# Patient Record
Sex: Male | Born: 1950
Health system: Southern US, Community
[De-identification: ages and names within clinical notes are randomized; demographics above are authoritative.]

## PROBLEM LIST (undated history)

## (undated) DIAGNOSIS — E785 Hyperlipidemia, unspecified: Secondary | ICD-10-CM

## (undated) DIAGNOSIS — I236 Thrombosis of atrium, auricular appendage, and ventricle as current complications following acute myocardial infarction: Secondary | ICD-10-CM

## (undated) DIAGNOSIS — F1011 Alcohol abuse, in remission: Secondary | ICD-10-CM

## (undated) DIAGNOSIS — I2510 Atherosclerotic heart disease of native coronary artery without angina pectoris: Secondary | ICD-10-CM

## (undated) DIAGNOSIS — I255 Ischemic cardiomyopathy: Secondary | ICD-10-CM

## (undated) DIAGNOSIS — I10 Essential (primary) hypertension: Secondary | ICD-10-CM

## (undated) HISTORY — DX: Atherosclerotic heart disease of native coronary artery without angina pectoris: I25.10

## (undated) HISTORY — DX: Thrombosis of atrium, auricular appendage, and ventricle as current complications following acute myocardial infarction: I23.6

## (undated) HISTORY — DX: Alcohol abuse, in remission: F10.11

## (undated) HISTORY — DX: Ischemic cardiomyopathy: I25.5

## (undated) HISTORY — PX: OTHER SURGICAL HISTORY: SHX169

## (undated) HISTORY — DX: Essential (primary) hypertension: I10

## (undated) HISTORY — DX: Hyperlipidemia, unspecified: E78.5

---

## 2005-11-05 ENCOUNTER — Inpatient Hospital Stay (HOSPITAL_COMMUNITY): Admission: EM | Admit: 2005-11-05 | Discharge: 2005-11-14 | Payer: Self-pay | Admitting: Emergency Medicine

## 2008-08-19 ENCOUNTER — Ambulatory Visit: Payer: Self-pay | Admitting: Cardiology

## 2008-08-29 HISTORY — PX: CORONARY STENT PLACEMENT: SHX1402

## 2008-09-16 ENCOUNTER — Encounter: Payer: Self-pay | Admitting: Cardiology

## 2008-09-21 ENCOUNTER — Telehealth (INDEPENDENT_AMBULATORY_CARE_PROVIDER_SITE_OTHER): Payer: Self-pay | Admitting: *Deleted

## 2008-09-22 ENCOUNTER — Inpatient Hospital Stay (HOSPITAL_COMMUNITY): Admission: AD | Admit: 2008-09-22 | Discharge: 2008-09-25 | Payer: Self-pay | Admitting: Cardiology

## 2008-09-22 ENCOUNTER — Ambulatory Visit: Payer: Self-pay | Admitting: Cardiology

## 2008-09-22 ENCOUNTER — Encounter: Payer: Self-pay | Admitting: Cardiology

## 2008-09-22 ENCOUNTER — Ambulatory Visit: Payer: Self-pay

## 2008-09-22 ENCOUNTER — Encounter: Payer: Self-pay | Admitting: Cardiovascular Disease

## 2008-09-22 DIAGNOSIS — R9430 Abnormal result of cardiovascular function study, unspecified: Secondary | ICD-10-CM | POA: Insufficient documentation

## 2008-09-22 DIAGNOSIS — E7800 Pure hypercholesterolemia, unspecified: Secondary | ICD-10-CM | POA: Insufficient documentation

## 2008-09-23 ENCOUNTER — Encounter: Payer: Self-pay | Admitting: Cardiovascular Disease

## 2008-09-23 ENCOUNTER — Encounter: Payer: Self-pay | Admitting: Cardiology

## 2008-09-24 ENCOUNTER — Encounter: Payer: Self-pay | Admitting: Cardiovascular Disease

## 2008-09-29 ENCOUNTER — Encounter: Payer: Self-pay | Admitting: Cardiology

## 2008-09-29 ENCOUNTER — Telehealth (INDEPENDENT_AMBULATORY_CARE_PROVIDER_SITE_OTHER): Payer: Self-pay | Admitting: *Deleted

## 2008-09-30 ENCOUNTER — Telehealth: Payer: Self-pay | Admitting: Cardiology

## 2008-10-05 ENCOUNTER — Ambulatory Visit: Payer: Self-pay | Admitting: Cardiovascular Disease

## 2008-10-05 LAB — CONVERTED CEMR LAB: POC INR: 1.1

## 2008-10-08 ENCOUNTER — Ambulatory Visit: Payer: Self-pay | Admitting: Cardiology

## 2008-10-08 DIAGNOSIS — I426 Alcoholic cardiomyopathy: Secondary | ICD-10-CM | POA: Insufficient documentation

## 2008-10-08 DIAGNOSIS — F17200 Nicotine dependence, unspecified, uncomplicated: Secondary | ICD-10-CM | POA: Insufficient documentation

## 2008-10-08 DIAGNOSIS — I2510 Atherosclerotic heart disease of native coronary artery without angina pectoris: Secondary | ICD-10-CM | POA: Insufficient documentation

## 2008-10-08 DIAGNOSIS — I238 Other current complications following acute myocardial infarction: Secondary | ICD-10-CM | POA: Insufficient documentation

## 2008-10-08 LAB — CONVERTED CEMR LAB: POC INR: 2.5

## 2008-11-03 ENCOUNTER — Encounter: Payer: Self-pay | Admitting: Cardiology

## 2008-11-04 ENCOUNTER — Ambulatory Visit: Payer: Self-pay | Admitting: Cardiology

## 2008-11-23 ENCOUNTER — Encounter: Payer: Self-pay | Admitting: Cardiology

## 2008-12-16 ENCOUNTER — Ambulatory Visit: Payer: Self-pay | Admitting: Cardiology

## 2009-01-26 ENCOUNTER — Encounter: Payer: Self-pay | Admitting: Cardiology

## 2009-01-27 ENCOUNTER — Encounter: Payer: Self-pay | Admitting: Cardiology

## 2009-02-18 ENCOUNTER — Encounter: Payer: Self-pay | Admitting: Cardiology

## 2009-03-09 ENCOUNTER — Encounter: Payer: Self-pay | Admitting: Cardiology

## 2009-03-09 ENCOUNTER — Ambulatory Visit (HOSPITAL_COMMUNITY): Admission: RE | Admit: 2009-03-09 | Discharge: 2009-03-09 | Payer: Self-pay | Admitting: Cardiology

## 2009-03-09 ENCOUNTER — Ambulatory Visit: Payer: Self-pay | Admitting: Cardiology

## 2009-04-26 ENCOUNTER — Telehealth: Payer: Self-pay | Admitting: Cardiovascular Disease

## 2009-05-05 ENCOUNTER — Encounter: Payer: Self-pay | Admitting: Cardiology

## 2009-05-31 ENCOUNTER — Encounter: Admission: RE | Admit: 2009-05-31 | Discharge: 2009-08-29 | Payer: Self-pay | Admitting: Family Medicine

## 2009-07-22 ENCOUNTER — Encounter: Payer: Self-pay | Admitting: Cardiology

## 2009-09-01 ENCOUNTER — Encounter: Payer: Self-pay | Admitting: Cardiology

## 2009-09-03 ENCOUNTER — Encounter: Payer: Self-pay | Admitting: Cardiology

## 2009-09-07 ENCOUNTER — Ambulatory Visit: Payer: Self-pay | Admitting: Cardiology

## 2009-12-20 ENCOUNTER — Telehealth: Payer: Self-pay | Admitting: Cardiology

## 2010-01-20 LAB — HEMOCCULT GUIAC POC 1CARD (OFFICE)

## 2010-05-22 ENCOUNTER — Encounter: Payer: Self-pay | Admitting: Family Medicine

## 2010-05-31 NOTE — Assessment & Plan Note (Signed)
Summary: 6 month rov 414.01 428.22  pfh,rn   Visit Type:  Follow-up Primary Provider:  Vernon Prey MD  CC:  CAD/Cardiomyopathy.  History of Present Illness: The pt presents for followup of the above. Since I last saw him he has had no new cardiovascular complaints. I repeated an echocardiogram and his ejection fraction has improved to 45%. There was no evidence of the previous apical thrombus. His Coumadin was discontinued and he remains on aspirin and Plavix. Since that time he has had no new chest pressure, neck or arm discomfort. He has had no palpitations, presyncope or syncope. He has had no PND or orthopnea. He continues to smoke but is down to one pack per day. He has now been abstaining from alcohol for 4 years.  Current Medications (verified): 1)  Crestor 20 Mg Tabs (Rosuvastatin Calcium) .... 1/2 Daily Except M,w,fri 1 Daily 2)  Plavix 75 Mg Tabs (Clopidogrel Bisulfate) .... Take One Tablet By Mouth Daily 3)  Lisinopril 5 Mg Tabs (Lisinopril) .... Take One Tablet By Mouth Daily 4)  Carvedilol 3.125 Mg Tabs (Carvedilol) .... One By Mouth Bid 5)  Nitroglycerin 0.4 Mg Subl (Nitroglycerin) .... One Tablet Under Tongue Every 5 Minutes As Needed For Chest Pain---May Repeat Times Three 6)  Aspirin 81 Mg  Tabs (Aspirin) .... Daily 7)  Vitamin D 1000 Unit Tabs (Cholecalciferol) .Marland Kitchen.. 1 By Mouth Daily  Allergies (verified): 1)  ! Penicillin  Past History:  Past Medical History: Hyperlipidemia. History of alcohol abuse now sober for 35 months CAD (s/p DES to LAD, occluded RCA) Ischemic cardiomyoapthy (EF 35% initially, 45% 9/11) Apical Thrombus  Review of Systems       As stated in the HPI and negative for all other systems.   Vital Signs:  Patient profile:   60 year old male Height:      69 inches Weight:      171 pounds BMI:     25.34 Pulse rate:   50 / minute Resp:     16 per minute BP sitting:   120 / 70  (right arm)  Vitals Entered By: Marrion Coy, CNA (Sep 07, 2009  2:58 PM)  Physical Exam  General:  Well developed, well nourished, in no acute distress. Head:  normocephalic and atraumatic Neck:  Neck supple, no JVD. No masses, thyromegaly or abnormal cervical nodes. Chest Wall:  no deformities or breast masses noted Lungs:  Dbreath sounds with few expiratory wheezes Abdomen:  Bowel sounds positive; abdomen soft and non-tender without masses, organomegaly, or hernias noted. No hepatosplenomegaly. Msk:  Back normal, normal gait. Muscle strength and tone normal. Extremities:  No clubbing or cyanosis. Neurologic:  Alert and oriented x 3. Skin:  Intact without lesions or rashes.   Detailed Cardiovascular Exam  Neck    Carotids: Carotids full and equal bilaterally without bruits.      Neck Veins: Normal, no JVD.    Heart    Inspection: no deformities or lifts noted.      Palpation: normal PMI with no thrills palpable.      Auscultation: regular rate and rhythm, S1, S2 without murmurs, rubs, gallops, or clicks.    Vascular    Abdominal Aorta: no palpable masses, pulsations, or audible bruits.      Femoral Pulses: normal femoral pulses bilaterally.      Pedal Pulses: pulses normal in all 4 extremities    Radial Pulses: normal radial pulses bilaterally.      Peripheral Circulation: no clubbing, cyanosis, or  edema noted with normal capillary refill.     EKG  Procedure date:  09/07/2009  Findings:      sinus bradycardia, rate 56, old inferior infarct, inferolateral T-wave inversions, no acute ST-T wave changes.  Impression & Recommendations:  Problem # 1:  CARDIOMYOPATHY (ICD-425.4) I am going to try to titrate his lisinopril to 5 mg a.m. and 2.5 mg p.m. I cannot titrate his beta blocker secondary to bradycardia.  Problem # 2:  CAD (ICD-414.00) No further cardiovascular testing is suggested. He will continue with risk reduction. Orders: EKG w/ Interpretation (93000)  Problem # 3:  TOBACCO ABUSE (ICD-305.1) We discussed again the need to  stop smoking completely.  Problem # 4:  HYPERCHOLESTEROLEMIA-PURE (ICD-272.0) He is having is followed by Dr. Christell Constant. He has an appointment to followup results that were not yet at target and medication changes are planned. I would suggest a goal LDL less than 100 and HDL greater than 50 in this patient with ongoing risk factors.  Patient Instructions: 1)  Your physician recommends that you schedule a follow-up appointment in: 6 months with Dr Antoine Poche 2)  Your physician recommends that you have  lab work at Dr [facility] office in 2 week for 428.22 v58.69 3)  Your physician has recommended you make the following change in your medication: Increase Lisinopril by 2.5 mg a day for a total of 7.5 mg daily 4)  You have been diagnosed with Congestive Heart Failure or CHF.  CHF is a condition in which a problem with the structure or function of the heart impairs its ability to supply sufficient blood flow to meet the body's needs.  For further information please visit www.cardiosmart.org for detailed information on CHF. 5)  Your physician recommends that you weigh, daily, at the same time every day, and in the same amount of clothing.  Please record your daily weights on the handout provided and bring it to your next appointment. Prescriptions: LISINOPRIL 2.5 MG TABS (LISINOPRIL) one daily with 5 mg daily for a total of 7.5 mg  #30 x 11   Entered by:   Charolotte Capuchin, RN   Authorized by:   Rollene Rotunda, MD, [facility]   Signed by:   Charolotte Capuchin, RN on 09/07/2009   Method used:   [facility] to        [facility] [phone]* (retail)       [address]       [place], [place]  [address]       Ph: [phone] or [phone]       Fax: [phone]   RxID:   [id number]

## 2010-05-31 NOTE — Miscellaneous (Signed)
  Clinical Lists Changes  Observations: Added new observation of ECHOINTERP: - Left ventricle: The cavity size was normal. Wall thickness was       normal. Systolic function was mildly to moderately reduced. The       estimated ejection fraction was in the range of 40% to 45%. Severe       hypokinesis of the entire inferoposterior and apical myocardium.       Doppler parameters are consistent with abnormal left ventricular       relaxation (grade 1 diastolic dysfunction). Acoustic contrast       opacification revealed no evidence of thrombus.     - Aortic valve: Trivial regurgitation.     Impressions:            - Cannot R/O apical thrombus. (03/09/2009 11:13)      Echocardiogram  Procedure date:  03/09/2009  Findings:      - Left ventricle: The cavity size was normal. Wall thickness was       normal. Systolic function was mildly to moderately reduced. The       estimated ejection fraction was in the range of 40% to 45%. Severe       hypokinesis of the entire inferoposterior and apical myocardium.       Doppler parameters are consistent with abnormal left ventricular       relaxation (grade 1 diastolic dysfunction). Acoustic contrast       opacification revealed no evidence of thrombus.     - Aortic valve: Trivial regurgitation.     Impressions:            - Cannot R/O apical thrombus.

## 2010-05-31 NOTE — Progress Notes (Signed)
Summary: refill  Phone Note Refill Request Message from:  Pharmacy on December 20, 2009 1:16 PM  Refills Requested: Medication #1:  NITROGLYCERIN 0.4 MG SUBL One tablet under tongue every 5 minutes as needed for chest pain---may repeat times three [facility] [phone]   Method Requested: Fax to Local Pharmacy Initial call taken by: Edman Circle,  December 20, 2009 1:16 PM  Follow-up for Phone Call        Rx sent into pharmacy.[facility]  Marrion Coy, CNA  December 20, 2009 1:56 PM  Follow-up by: Marrion Coy, CNA,  December 20, 2009 1:56 PM    Prescriptions: NITROGLYCERIN 0.4 MG SUBL (NITROGLYCERIN) One tablet under tongue every 5 minutes as needed for chest pain---may repeat times three  #25 x 8   Entered by:   Marrion Coy, CNA   Authorized by:   Rollene Rotunda, MD, [facility]   Signed by:   Marrion Coy, CNA on 12/20/2009   Method used:   Electronically to        [facility] [phone]* (retail)       [address]       [place], [place]  [address]       Ph: [phone] or [phone]       Fax: [phone]   RxID:   [id number]

## 2010-08-09 LAB — BASIC METABOLIC PANEL
BUN: 11 mg/dL (ref 6–23)
CO2: 28 mEq/L (ref 19–32)
CO2: 30 mEq/L (ref 19–32)
Calcium: 8.8 mg/dL (ref 8.4–10.5)
Chloride: 105 mEq/L (ref 96–112)
Chloride: 111 mEq/L (ref 96–112)
Creatinine, Ser: 0.87 mg/dL (ref 0.4–1.5)
GFR calc Af Amer: >60 mL/min (ref >60)
Glucose, Bld: 89 mg/dL (ref 70–99)
Potassium: 3.9 mEq/L (ref 3.5–5.1)
Sodium: 140 mEq/L (ref 135–145)

## 2010-08-09 LAB — CBC
HCT: 44.3 % (ref 39.0–52.0)
HCT: 46.6 % (ref 39.0–52.0)
HCT: 47.1 % (ref 39.0–52.0)
Hemoglobin: 15.5 g/dL (ref 13.0–17.0)
Hemoglobin: 16 g/dL (ref 13.0–17.0)
MCHC: 34 g/dL (ref 30.0–36.0)
MCHC: 34 g/dL (ref 30.0–36.0)
MCHC: 34.4 g/dL (ref 30.0–36.0)
MCV: 93.9 fL (ref 78.0–100.0)
MCV: 95 fL (ref 78.0–100.0)
Platelets: 173 10*3/uL (ref 150–400)
RDW: 12.6 % (ref 11.5–15.5)
RDW: 12.9 % (ref 11.5–15.5)
RDW: 12.9 % (ref 11.5–15.5)
WBC: 7.7 10*3/uL (ref 4.0–10.5)
WBC: 8.2 10*3/uL (ref 4.0–10.5)
WBC: 8.6 10*3/uL (ref 4.0–10.5)

## 2010-08-09 LAB — LIPID PANEL
Cholesterol: 120 mg/dL (ref 0–200)
LDL Cholesterol: 76 mg/dL (ref 0–99)
Triglycerides: 47 mg/dL (ref <150)
VLDL: 9 mg/dL (ref 0–40)

## 2010-08-09 LAB — COMPREHENSIVE METABOLIC PANEL
ALT: 17 U/L (ref 0–53)
Glucose, Bld: 102 mg/dL — ABNORMAL HIGH (ref 70–99)
Potassium: 4.5 mEq/L (ref 3.5–5.1)

## 2010-08-09 LAB — CARDIAC PANEL(CRET KIN+CKTOT+MB+TROPI)
CK, MB: 2.9 ng/mL (ref 0.3–4.0)
CK, MB: 3.2 ng/mL (ref 0.3–4.0)
Relative Index: 3 — ABNORMAL HIGH (ref 0.0–2.5)
Relative Index: INVALID (ref 0.0–2.5)
Troponin I: 0.02 ng/mL (ref 0.00–0.06)
Troponin I: 0.02 ng/mL (ref 0.00–0.06)

## 2010-08-09 LAB — TSH: TSH: 1.646 u[IU]/mL (ref 0.350–4.500)

## 2010-08-09 LAB — HEPARIN LEVEL (UNFRACTIONATED): Heparin Unfractionated: 0.47 IU/mL (ref 0.30–0.70)

## 2010-08-09 LAB — PROTIME-INR
INR: 1 (ref 0.00–1.49)
Prothrombin Time: 13.2 seconds (ref 11.6–15.2)

## 2010-08-14 ENCOUNTER — Encounter: Payer: Self-pay | Admitting: Family Medicine

## 2010-09-13 NOTE — Cardiovascular Report (Signed)
NAME:  John Leon, LONGSWORTH NO.:  [id number]   MEDICAL RECORD NO.:  [id number]          PATIENT TYPE:  INP   LOCATION:  2501                         FACILITY:  [facility]   PHYSICIAN:  Verne Carrow, MDDATE OF BIRTH:  Sep 11, 1950   DATE OF PROCEDURE:  09/23/2008  DATE OF DISCHARGE:                            CARDIAC CATHETERIZATION   PRIMARY CARDIOLOGIST:  Rollene Rotunda, MD, [facility]   PROCEDURE PERFORMED:  Percutaneous coronary intervention with placement  of a Xience drug-eluting stent in the mid left anterior descending  coronary artery.   OPERATOR:  Verne Carrow, MD   INDICATIONS:  This is a 60 year old Caucasian male with a history of  hyperlipidemia and former alcohol abuse as well as tobacco use who was  found to have an abnormal stress Myoview and was referred today for a  diagnostic left heart catheterization.  The diagnostic catheterization  was performed by Dr. Charlton Haws.  The patient was found to have a  severe stenosis in the mid left anterior descending coronary artery.  Dr. Eden Emms consulted me to perform a percutaneous coronary intervention  on this severe stenosis.   PROCEDURE IN DETAIL:  The patient was placed in the holding area after  his diagnostic catheterization.  I have talked to both he and his family  in the holding area.  He was given 600 mg of Plavix in the holding area  prior to coming back to the cath lab.  He had signed informed consent  for a possible percutaneous coronary intervention prior to his  diagnostic procedure.  He was brought back into the main cardiac  catheterization laboratory and was given 2 mg of Versed for sedation.  The right groin was prepped and draped in a sterile fashion.  Sheath  exchange was performed in a sterile manner.  The sheath was upsized to a  6-French sheath.  The patient was given a bolus of Angiomax and a drip  was started.  Once the ACT was greater than 200, I selectively engaged  the left  main coronary artery with an XB LAD 4.0 6-French guiding  catheter.  A Cougar intracoronary wire was then passed with ease down  the left anterior descending coronary artery.  A 2.5 x 15 mm balloon was  then inflated in the area of tightest stenosis.  This balloon was  removed and a 2.75 x 15 mm Xience drug-eluting stent was then deployed  in the area of tightest stenosis.  A 3.0 x 12 mm noncompliant balloon  was then used to postdilate the stented segment.  Flow was excellent  both before and after the procedure down the left anterior descending  coronary artery.  The stenosis in the mid LAD was taken from an 80% to  0% following the intervention.  Of note, there was a large septal  perforator that was in the area of tightest stenosis that had a 90%  ostial lesion both before and after the intervention.  There is also a  diagonal branch that had a 30% stenosis following the intervention.  There was excellent flow down all of these vessels at the end of  the  intervention.  The patient was taken to the holding area in stable  condition.   IMPRESSION:  Successful percutaneous coronary intervention with  placement of a Xience drug-eluting stent in the mid left anterior  descending coronary artery.   RECOMMENDATIONS:  I would recommend this patient to continue aspirin and  Plavix for at least 1 year.  We will also continue his beta-blocker and  statin therapy.  Of note, the patient was found to have an apical left  ventricular thrombus and has been maintained on a heparin drip here in  the hospital.  We will restart his heparin drip 6 hours following sheath  removal and we will start Coumadin today per pharmacy recommendations.      Verne Carrow, MD  Electronically Signed     CM/MEDQ  D:  09/23/2008  T:  09/24/2008  Job:  [id number]   cc:   Rollene Rotunda, MD, [facility]

## 2010-09-13 NOTE — Cardiovascular Report (Signed)
NAME:  John Leon, John Leon             ACCOUNT NO.:  [id number]   MEDICAL RECORD NO.:  [id number]          PATIENT TYPE:  INP   LOCATION:  2501                         FACILITY:  [facility]   PHYSICIAN:  Noralyn Pick. Eden Emms, MD, FACCDATE OF BIRTH:  Sep 23, 1950   DATE OF PROCEDURE:  DATE OF DISCHARGE:                            CARDIAC CATHETERIZATION   PROCEDURE:  Coronary arteriography.   INDICATION:  Chest pain with abnormal Myoview.   Cine catheterization was done from right femoral artery using 5-French  catheters.   Left main coronary artery was normal.   Left anterior descending artery was normal proximally.  There was a  medium-sized intermediate branch and a large first diagonal branch,  which were normal.  Just after the takeoff of the first diagonal branch  and takeoff of the septal perforator, there was an 80% eccentric lesion.  The distal LAD was small and really did not reach the apex.  There were  significant septal collaterals to a totally occluded right coronary  artery.   The circumflex coronary artery was normal.  There was one large obtuse  marginal branch and an AV branch, which were normal.   The right coronary artery was 100% occluded proximally.  There were  faint right-to-right collaterals, but most of the collaterals filling  the PDA and PLA came from the distal circ and septal perforators.   RAO VENTRICULOGRAPHY:  RAO ventriculography showed an EF of about 40%.  There was a fairly large mural apical thrombus.  The mid and apical  inferior wall, John Leon apex and anterior apex were akinetic.   There was no significant MR.   Aortic pressure was 104/66, LV pressure was 115/70.   IMPRESSION:  The patient has severe two-vessel disease with decreased  left ventricular function and a mural apical thrombus.  I will review  the films with Dr. Clifton James.  I suspect that he will need intervention  to his mid left anterior descending, I would not intervene on a  chronically  occluded right coronary artery with good collaterals.  Since  he has collaterals from the distal circumflex is well, I do not think  that the intervention to the mid left anterior descending would be  terribly high risk.  Unfortunately, he will need to be probably on  aspirin, Plavix, and Coumadin given his significant mural apical  thrombus.     Noralyn Pick. Eden Emms, MD, [facility]  Electronically Signed    PCN/MEDQ  D:  09/23/2008  T:  09/23/2008  Job:  [id number]

## 2010-09-13 NOTE — Assessment & Plan Note (Signed)
[facility]                            CARDIOLOGY OFFICE NOTE   MAYFIELD, SCHOENE                      MRN:          [id number]  DATE:11/04/2008                            DOB:          01/03/1951    PRIMARY CARE PHYSICIAN:  Ernestina Penna, MD   REASON FOR PRESENTATION:  Evaluate the patient with coronary artery  disease and cardiomyopathy.   HISTORY OF PRESENT ILLNESS:  The patient presents for followup of his  problems as above.  The last visit I switched him from metoprolol to  carvedilol.  Unfortunately, he was taking both medications.  He says he  has been having some increased fatigue and little lightheadedness in the  afternoon, who has had no presyncope or syncope.  He has been able to be  active, playing golf.  He is not having any chest discomfort, neck, or  arm discomfort.  He denies any shortness of breath, PND, or orthopnea.  He still smoking a pack a day.   PAST MEDICAL HISTORY:  Coronary artery disease (LAD 80% stenosis,  circumflex normal, right coronary artery occluded.  He had a drug-  eluting stent to the mid LAD), cardiomyopathy (EF 35%), hyperlipidemia,  history of alcohol abuse (sober for 35 years), apical thrombus.   ALLERGIES:  PENICILLIN.   MEDICATIONS:  1. Crestor 20 mg daily.  2. Lisinopril 5 mg daily.  3. Plavix 75 mg daily.  4. Coreg 3.125 mg b.i.d.  5. Metoprolol 12.5 mg b.i.d.  6. Coumadin.  7. Aspirin 81 mg daily.   REVIEW OF SYSTEMS:  As stated in the HPI, otherwise negative for other  systems.   PHYSICAL EXAMINATION:  GENERAL:  The patient is in no distress.  VITAL SIGNS:  Blood pressure 96/72, heart rate 56 and regular.  HEENT:  Eyes are unremarkable, pupils equal, round, and reactive to  light, fundi not visualized, oral mucosa normal.  NECK:  No jugular distention at 45 degrees, carotid upstroke brisk and  symmetrical.  No bruits or thyromegaly.  LUNGS:  Clear to auscultation bilaterally.  BACK:   No costovertebral tenderness.  HEART:  PMI not displaced or sustained, S1 and S2 within normal limits,  no S3, no S4, no clicks, no rubs, no murmurs.  ABDOMEN:  Obese, positive  bowel sounds, normal frequency and pitch, no bruits, rebound, guarding,  or midline pulsatile mass.  No hepatomegaly or splenomegaly.  SKIN:  No rashes, no nodules.  EXTREMITIES:  2+ pulses, no edema, cyanosis, or clubbing.  NEUROLOGIC:  Oriented to person, place, and time.  Cranial nerves II-XII  grossly intact, motor grossly intact.   ASSESSMENT AND PLAN:  1. Coronary artery disease.  He is having no new symptoms.  No further      cardiovascular testing is suggested.  We will continue with risk      reduction.  2. Mural thrombus.  I will continue the Coumadin for about 6 months or      might stop it sooner given the fact that he is on 3 different blood      thinners.  3. Tobacco abuse.  He understands the need to quit smoking completely.  4. Cardiomyopathy.  I can titrate his meds because he is fatigued.  I      am stopping the metoprolol clarifying this with him.  In the      future, I will try to titrate his medications again.  5. Followup.  We will see him back in 6 weeks or sooner if needed.     Rollene Rotunda, MD, [facility]  Electronically Signed    JH/MedQ  DD: 11/04/2008  DT: 11/05/2008  Job #: [id number]   cc:   Ernestina Penna, M.D.

## 2010-09-13 NOTE — Assessment & Plan Note (Signed)
[facility]                            CARDIOLOGY OFFICE NOTE   John Leon, John Leon                      MRN:          [id number]  DATE:12/16/2008                            DOB:          1950-09-16    PRIMARY:  Ernestina Penna, MD   REASON FOR PRESENTATION:  Evaluate the patient with coronary artery  disease and cardiomyopathy.   HISTORY OF PRESENT ILLNESS:  The patient presents for followup of the  above.  Since I last saw him, he has had no new cardiovascular  complaints.  We clarified his beta-blockers.  I did not titrate any meds  at the last visit.  He presents now and says he is doing relatively  well.  He still needs to take a nap in the afternoon, but he is able to  do his activities of daily living.  He does not have any shortness of  breath, PND, or orthopnea.  He is not noticing any palpitations.  He has  had no presyncope or syncope.  He has not been having any chest  discomfort, neck or arm discomfort.  He is still working on a cardiac  rehab at [employer].  He is smoking about a pack a day which is down from  his peak.   PAST MEDICAL HISTORY:  Coronary artery disease (LAD 80% stenosis,  circumflex normal, right coronary artery occluded.  He had a drug-  eluting stent to the mid LAD), cardiomyopathy (EF 35%), hyperlipidemia,  history of alcohol abuse (sober for 35 years), and apical thrombus.   ALLERGIES:  PENICILLIN.   MEDICATIONS:  1. Crestor 10 mg daily.  2. Lisinopril 5 mg daily.  3. Plavix 75 mg daily.  4. Coreg 3.125 mg b.i.d.  5. Coumadin.  6. Aspirin 81 mg daily.   REVIEW OF SYSTEMS:  As stated in the HPI and otherwise negative for all  other systems.   PHYSICAL EXAMINATION:  GENERAL:  The patient is pleasant and in no  distress.  VITAL SIGNS:  Blood pressure 102/68, heart rate 58 and regular.  HEENT:  Eyes are unremarkable.  Pupils are equal, round, and reactive to  light.  Fundi not visualized.  Oral mucosa  unremarkable.  NECK:  No jugular venous distention at 45 degrees.  Carotid upstroke  brisk and symmetrical.  No bruits.  No thyromegaly.  LYMPHATICS:  No cervical, axillary, or inguinal adenopathy.  LUNGS:  Clear to auscultation bilaterally.  BACK:  No costovertebral angle tenderness.  CHEST:  Unremarkable.  HEART:  PMI not displaced or sustained.  S1 and S2 within normal limits.  No S3, no S4, no clicks, no rubs, no murmurs.  ABDOMEN:  Obese, positive bowel sounds normal in frequency and pitch.  No bruits, no rebound, no guarding, no midline pulsatile mass.  No  hepatomegaly.  No splenomegaly.  SKIN:  No rashes.  No nodules.  EXTREMITIES:  2+ pulses throughout.  No edema, no cyanosis, no clubbing.  NEURO:  Oriented to person, place, and time.  Cranial nerves II-XII  grossly intact.  Motor grossly intact.   ASSESSMENT/PLAN:  1. Cardiomyopathy:  I am not able to titrate the meds as his blood      pressure and heart rate would not allow.  I will try to do this in      the future.  My plan is to repeat an echo in November which is      about 6 months from the time of his procedure.  I will be looking      at the apical thrombus as well as ejection fraction and make      further decisions about other therapies such as defibrillators.  2. Apical thrombus:  He will remain on the Coumadin and other drugs      for now.  However, if there is no change of the apical thrombus (as      it appears stable), most likely get rid of the Coumadin after the      echocardiogram in November.  3. Coronary artery disease:  He will continue with risk reduction.  No      further cardiovascular testing is suggested.  4. Tobacco abuse:  I took the liberty again of discussing smoking      cessation with him and he is trying to cut back.  5. Alcohol abuse:  He has been recovering alcoholic for years and      remains sober.  6. Dyslipidemia:  Per Dr. Christell Constant.  He is back taking his Crestor at 10      mg.  He  should have an LDL less than 70 and HDL greater than 40 as      the goal.  7. Followup:  I will see him back in November 2010.     Rollene Rotunda, MD, [facility]  Electronically Signed    JH/MedQ  DD: 12/16/2008  DT: 12/17/2008  Job #: [id number]   cc:   Ernestina Penna, M.D.

## 2010-09-13 NOTE — Assessment & Plan Note (Signed)
[facility]                            CARDIOLOGY OFFICE NOTE   John Leon, SPILLERS                      MRN:          [id number]  DATE:08/19/2008                            DOB:          01-20-1951    PRIMARY CARE PHYSICIAN:  Ernestina Penna, MD   REASON FOR PRESENTATION:  Evaluate the patient with an abnormal EKG.   HISTORY OF PRESENT ILLNESS:  The patient is a pleasant 60 year old  gentleman without prior cardiac history.  He does have significant  dyslipidemia, a very strong family history.  He was recently noted to  have an abnormal EKG with inferior Q-waves suggestive of a possible old  inferior infarct.  There is a distant EKG, which demonstrates the same  finding.  The patient does not report any past cardiac history and in  particularly denies any chest discomfort.  He has never been told he had  a heart attack.  He does not report any previous cardiovascular testing.  He does exercise routinely.  He exercises on a bike 4 miles at a time 3  times a week.  He golfs and walks the course.  He denies any chest  pressure, neck or arm discomfort.  He does not have any palpitations,  presyncope, or syncope.  He has no shortness of breath and denies any  PND or orthopnea.   He does have dyslipidemia and I reviewed these labs.  He has had an LDL  in the 180s.  However, he had a tremendous response to 10 mg of Crestor.  However, when he stopped it, it went back to the 170s.  He stopped the  Crestor on his own.  He was told to restart it, but he had not yet done  this as he had not gotten this message.   PAST MEDICAL HISTORY:  1. Hyperlipidemia.  2. EtOH abuse with withdrawal and hospitalization (he has been sober      since 2007).   PAST SURGICAL HISTORY:  None.   ALLERGIES:  PENICILLIN.   MEDICATIONS:  Currently, none.   SOCIAL HISTORY:  The patient is a self-employed IT trainer.  He is single.  He  has 3 children.  He has grandchildren.  He  smokes 1-3/4 packs and has  for 36 years.  He no longer drinks alcohol.   FAMILY HISTORY:  Remarkable for his father having myocardial infarctions  in his 85s.   REVIEW OF SYSTEMS:  As stated in the HPI and negative for all other  systems.   PHYSICAL EXAMINATION:  GENERAL:  The patient is pleasant and in no  distress.  VITAL SIGNS:  Blood pressure 160/98, heart rate 73 and regular, weight  170 pounds, and body mass index 25.  HEENT:  Eyelids unremarkable; pupils are equal, round, and reactive to  light; fundi not visualized; oral mucosa unremarkable.  NECK:  No jugular venous distention at 45 degrees, carotid upstroke  brisk and symmetric, no bruits, no thyromegaly.  LYMPHATICS:  No cervical, axillary, or inguinal adenopathy.  LUNGS:  Clear to auscultation bilaterally.  BACK:  No costovertebral angle tenderness.  CHEST:  Unremarkable.  HEART:  PMI not displaced or sustained; S1 and S2 within normal limits,  no S3, no S4; no clicks, no rubs, no murmurs.  ABDOMEN:  Flat; positive bowel sounds, normal in frequency and pitch; no  bruits, no rebound, no guarding, no midline pulsatile mass; no  hepatomegaly, no splenomegaly.  SKIN:  No rashes, no nodules.  EXTREMITIES:  Pulses 2+ throughout, no edema, no cyanosis, no clubbing.  NEUROLOGIC:  Oriented to person, place, and time; cranial nerves II  through XII grossly intact; motor grossly intact.   EKG as above.   ASSESSMENT AND PLAN:  1. Abnormal EKG.  The patient has an abnormal EKG.  He has significant      cardiovascular risk factors.  However, he exercises and has no      symptoms.  I think he does need to be screened, but I think a plain      old exercise treadmill would be a reasonable screening test with a      low-low moderate pretest probability.  I will ask Dr. Christell Constant if he      wants to arrange this for the patient.  I discussed this with the      patient.  2. Dyslipidemia.  I have reviewed the labs.  I will restart  Crestor.      He is to get his labs checked again in 3 months.  3. Tobacco.  We spent quite a bit of time (greater than 3 minutes)      discussing tobacco cessation.  He does not think he wants to do      this at this point because he does not want to gain the weight.  He      does agree that he will taper and cut down the amount.  We      discussed Chantix and he may consider this in the future.  4. Hypertension.  Blood pressure is elevated today, but I reviewed all      of his previous readings and they are usually below 140/90.  This      can be assessed at the time of his treadmill.  A few pounds of      weight loss also will help.  However, if he remains above 140/90,      he understands this needs to be treated.  5. Followup.  I will see him back as needed, based on the results of      the treadmill or any future symptoms.     Rollene Rotunda, MD, [facility]  Electronically Signed    JH/MedQ  DD: 08/19/2008  DT: 08/20/2008  Job #: [id number]   cc:   Ernestina Penna, M.D.

## 2010-09-13 NOTE — Discharge Summary (Signed)
NAME:  John Leon, John Leon NO.:  [id number]   MEDICAL RECORD NO.:  [id number]          PATIENT TYPE:  INP   LOCATION:  2501                         FACILITY:  [facility]   PHYSICIAN:  Verne Carrow, MDDATE OF BIRTH:  May 30, 1950   DATE OF ADMISSION:  09/22/2008  DATE OF DISCHARGE:  09/25/2008                               DISCHARGE SUMMARY   PRIMARY CARDIOLOGIST:  Rollene Rotunda, MD, [facility]   PRIMARY CARE PHYSICIAN:  Ernestina Penna, MD at [facility].   DISCHARGING DIAGNOSES:  1. Coronary artery disease, status post cardiac      catheterization/percutaneous coronary intervention on this      admission.  The patient had placement of a Xience drug-eluting      stent in the mid LAD by Dr. Verne Carrow on Sep 23, 2008.  2. Mural apical thrombus.  The patient being bridged with Lovenox and      anticoagulation therapy with warfarin at the time of discharge.  3. Dyslipidemia.  4. Remote history of EtOH abuse.  5. Ongoing tobacco abuse.  6. Family history of premature coronary artery disease.   HOSPITAL COURSE:  John Leon is a 60 year old male who was somewhat  active.  He exercises on a bike 4 miles at a time 3 days a week.  He  plays golf and walks to the course.  Past medical history as stated  above.  The patient evaluated, noted to have an abnormal EKG.  Chest  pain with abnormal Myoview, he was scheduled for cardiac  catheterization.  John Leon underwent cardiac catheterization on May  26.  He was found to have severe two-vessel disease with decreased left  ventricular function and a mural apical thrombus.  He underwent  intervention at that time with placement of a Xience drug-eluting stent  in the mid LAD.  The patient tolerated procedure without complications.  Because of the mural thrombus, he was started on Coumadin when  appropriate and will be bridged with Lovenox at home.  Also, established  beta-blocker therapy.   Continue statin therapy.  At time of discharge,  the patient has pending results of the cardiac MRI to determine left  ventricular function and quiet at apex.  The patient has had cardiac  rehabilitation and smoking cessation education provided.  Pharmacy has  assisted in Lovenox dosing.  The patient is stable to be discharged home  following assessment by Dr. Clifton James on day of discharge.   MEDICATIONS AT THE TIME OF DISCHARGE:  1. Aspirin 325.  2. Plavix 75.  3. Lopressor 25 mg.  The patient is to take half a tablet twice a day.  4. Lisinopril 5 mg.  5. Crestor 10.  6. Lovenox 80 mg subcu twice a day until Tuesday.  Last dose will be      Tuesday morning, June 1.  7. Warfarin 7.5 mg daily.  8. Nitroglycerin as needed.   It is scheduled for him to have a PT/INR drawn at Dr. Kathi Der office on  Tuesday, June 1.  Follow up as an initial [facility] patient, visit  on June 7  at 1:30 in our office and then follow up with Dr. Antoine Poche,  June 10 at 11  a.m.  The patient has been given a prescription for all of the above  medications except for aspirin and Crestor, which he already has.  I  will call Dr. Kathi Der office and schedule him to come in on Tuesday,  June 1 for PT/INR time before he leaves today.  Duration of discharge  encounter is well over 30 minutes.      Dorian Pod, ACNP      Verne Carrow, MD  Electronically Signed    MB/MEDQ  D:  09/25/2008  T:  09/26/2008  Job:  [id number]   cc:   Ernestina Penna, M.D.

## 2010-09-14 ENCOUNTER — Other Ambulatory Visit: Payer: Self-pay | Admitting: Cardiology

## 2010-09-16 NOTE — Discharge Summary (Signed)
NAME:  John Leon, John Leon NO.:  [id number]   MEDICAL RECORD NO.:  [id number]          PATIENT TYPE:  INP   LOCATION:  6738                         FACILITY:  [facility]   PHYSICIAN:  Michaelyn Barter, M.D. DATE OF BIRTH:  [birth year]   DATE OF ADMISSION:  11/05/2005  DATE OF DISCHARGE:                                 DISCHARGE SUMMARY   INTERIM DISCHARGE SUMMARY   DATE OF DISCHARGE:  Yet to be determined.   FINAL DIAGNOSES:  1.  Severe hypokalemia.  2.  Altered mental status.  3.  Chronic alcohol abuse.  4.  Alcoholic delirium tremens.  5.  Hepatic encephalopathy.  6.  Hepatocellular disease suggestive of cirrhosis of the liver.  7.  Alcoholic hepatitis.  8.  Severe malnutrition.  9.  Mild pancreatitis.  10. Hypomagnesium.  11. Dehydration.  12. Hypocalcemia.  13. Anemia.  14. Weakness and deconditioning most likely secondary to alcohol abuse.   PROCEDURES:  1.  Portable chest x-ray completed November 05, 2005.  2.  Ultrasound of the abdomen completed November 06, 2005.  3.  CT scan of the head without contrast completed November 08, 2005.   CONSULTATIONS:  Psychiatry.   PRIMARY CARE PHYSICIAN:  Unassigned.   HISTORY OF PRESENT ILLNESS:  John Leon is a 60 year old gentleman, who  was brought to the hospital with a chief complaint of altered mental status.  According to the patient's family, he had been failing to thrive over the  past seven months prior to this admission, and he had also been falling  frequently.  They indicated that the patient had been an alcoholic for at  least 40 years and continued to drink heavily up until the time of his  admission.  He had been constantly drinking and not eating and had become  progressively more confused leading up to this admission.  For past medical  history, please see that dictated by Dr. Lonia Blood.   HOSPITAL COURSE:  1.  Mental status changes:  The patient had a CT scan of his head completed      on July 7th, which  revealed diffusely enlarged ventricles and      subarachnoid spaces, no skull fracture, intracranial hemorrhage, mass,      mass effect, or paranasal sinus air fluid levels were seen.  No acute      abnormalities were seen.  The patient's mental status changes appeared      to be multifactorial with the biggest problem being his chronic alcohol      abuse, which brings me to number 2.  2.  Chronic alcohol abuse:  The patient was started on the alcohol      withdrawal protocol utilizing Ativan.  When the patient first was      admitted into the hospital, it was not possible to converse with the      patient secondary to him being extremely lethargic and extremely      confused.  However, over the past few days, the patient has become much      more arousable and likewise more conversant.  3.  Alcoholic  delirium tremens:  Several days after the patient's admission      into the hospital, not only did he become more aroused, but he also      became agitated, and it was reported that he would occasionally argue      with the staff and display threatening gestures.  Therefore, the patient      was restrained on several occasions to prevent him from hurting himself      in particular.  Over the past two days at least, the patient's agitation      has decreased significantly.  He has become much more cooperative, less      combatant, and it appears as though he may be clearing the DT stage.  4.  Hepatic encephalopathy:  When the patient arrived into this hospital,      his liver enzymes were noted to have been significantly elevated with an      SGOT of 58, a total bilirubin of 1.5.  He also had an ammonia drawn,      which was found to be 93.  The patient was started on lactulose      secondary to this elevated ammonia level, which was accompanied by      mental status changes.  The patient's confusion did appear to improve      lightly and then he was more arousable and could occasionally  converse;      however, the patient was extremely confused at times, indicating that      the year was 65 and 1908.  His speech at times was extremely difficult      to understand.  However, again, his mental status has improved      significantly over the course of his hospitalization.  5.  Hepatocellular disease:  The patient had an ultrasound completed on July      9th, which had a final impression that there were no gallstones or      gallbladder wall thickening present.  The common bile duct appeared to      be within normal limits.  However, there was abnormal increased      echodensity of the liver.  The radiologist indicated that he did not      detect any lobulations of the hepatic contour to strongly suggest      cirrhosis.  However, the findings were compatible with diffuse      hepatocellular disease, and he indicated that it is possible that there      are some changes of hepatic fibrosis/cirrhosis of the liver present.      Given the patient's history of chronic alcohol consumption, I believe      that it may be safe to assume that the findings of the radiologist is,      in fact, cirrhosis in the patient's liver.  6.  Alcohol hepatitis:  Again, the patient's liver enzymes have been      elevated throughout the course of his hospitalization and the most      likely culprit for the elevation is his chronic alcohol consumption.  He      has not complained of any abdominal-related symptoms, however,      throughout the course of his hospitalization.  The patient did have an      alpha-fetoprotein level drawn, which was found to be negative at 4.4.      He also had a CA19-9 drawn, which was 29.0.  His hepatitis studies were  all found to be negative.  Therefore, again, the elevated liver enzymes      are more likely secondary to his heavy alcohol consumption.  7.  Severe malnutrition:  The patient had a pre-albumin drawn, which was     significantly low at 8.1.  This was  more than likely consistent with the      patient's family history.  They each indicated that the patient had not      been caring for himself and rather than eating he had limited his p.o.      intake to alcohol.  Likewise, his daughter indicated to me that the      patient had lost a significant amount of weight over some time.      Therefore, Nutrition was consulted and the patient was encouraged to      consume regular meals over the course of his hospitalization.  8.  Mild pancreatitis:  When the patient arrived, an amylase was completed.      It was found to be 218.  The patient did not complain of any abdominal      pain throughout the course of his hospitalization.  He was on the Ativan      protocol and was found to be extremely sleepy during the course of his      hospitalization.  This may have prevented him some complaining; however,      the patient has been in the hospital for quite some time now and he has      not complained of abdominal pains at all.  9.  Hypomagnesium:  The patient's magnesium level has been noted to be low      throughout the entire course of his hospitalization.  He has received a      combination of IV and p.o. magnesium, and it is very slow to respond to      treatment.  This will have to continue to be addressed during this      hospitalization.  When he initially arrived into the hospital, his      magnesium level was 1.1, and as of today, July 15th, the patient's      magnesium level is noted to be 1.1.  10. Severe hypokalemia:  When the patient arrived, his potassium level was      severely low with the result being less than 2.0.  He has received      aggressive IV and p.o. potassium supplementation throughout the course      of his hospitalization, and this situation is currently at a state of      resolution with a potassium level as of July 15th being 4.9.  11. Hypocalcemia:  The patient had symptoms related to his hypocalcemia when      he  first arrived into the hospital.  He was admitted to a telemetry      floor as a result.  His calcium was noted to have been 7.7 when he      arrived into the hospital.  This has been treated aggressively over the      past hospitalization.  12. Anemia:  Iron studies have been conducted with regards to this.  The      patient's iron level is noted to be 92.  His TIBC was not calculated.      It does not appear that the patient suffers from iron deficiency based      on these lab values.  Likewise,  his stools have been sent for occult      blood and have been found to be negative.  The patient's B-12 was found      to be 769, which is within the normal range.  His folate level was      ordered, however, the results of which are not recorded.  This will need      to be followed.  13. Dehydration:  This has resolved with aggressive IV fluid hydration over      the course of his hospitalization. 14. Low phosphorus:  This has been replaced during this hospitalization.  I      suspect that the patient's discharge should be sometime soon.  His      mental status has improved significantly throughout the course of his      hospitalization, and he only suffers from minor electrolyte      abnormalities.  Therefore, I would assume that within the next one to      two days, the patient may be medically stable for discharge.  Case      Management has been contacted, and they are working with the family for      placement into a facility that can provide him with both alcohol      treatment and also physical therapy.      Michaelyn Barter, M.D.  Electronically Signed     OR/MEDQ  D:  11/12/2005  T:  11/12/2005  Job:  [id number]

## 2010-09-16 NOTE — Consult Note (Signed)
NAME:  John Leon NO.:  [id number]   MEDICAL RECORD NO.:  [id number]          PATIENT TYPE:  INP   LOCATION:  6738                         FACILITY:  [facility]   PHYSICIAN:  Antonietta Breach, M.D.  DATE OF BIRTH:  [birth year]   DATE OF CONSULTATION:  11/13/2005  DATE OF DISCHARGE:                                   CONSULTATION   REASON FOR CONSULTATION:  Alcohol dependence, delirium, followup as well as  depression.   REQUESTING PHYSICIAN:  Dr. Lorre Nick.   John Leon is a 61 year old male admitted to the [facility]  on the November 05, 2005 with acute mental status changes.  The patient was  stuporous.  He had been drinking alcohol around the clock and not eating.  He had been falling repeatedly.  He was hallucinating and seeing apple pies  in the room.  His speech was slurred and tangential.  His thought process  was disorganized.  By history, it was gathered that he was drinking at least  a gallon of Vodka a week and living alone.  The patient was started on the  Ativan withdrawal protocol.  It was also reported that the patient had had  depressed mood and anhedonia as well as poor concentration and low energy.   PAST PSYCHIATRIC HISTORY:  No history of mania.  No history of major  depression.  No history of suicide attempts.  No history of psychiatric  treatment.  No history of psychotropics.   FAMILY PSYCHIATRIC HISTORY:  None known.   SOCIAL HISTORY:  The patient is divorced.  He has been living alone.  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  He has had recent plans to move his business into his house.  He uses no illegal drugs.  He is currently living in his home.  Upon  discharge, will be his youngest son.  He has an older son that lives in the  area as well as one daughter.  He also has a brother that lives in the area.   GENERAL MEDICAL PROBLEMS:  The patient continues with  weakness, but has been  able to work with physical therapy and ambulate with a walker.  He is  recovering from hypokalemia, cirrhosis, anemia.   MEDICATIONS:  The MAR is reviewed.  Psychotropics include Haldol 1-4 mg  q.8h. p.r.n.   ALLERGIES:  PENICILLIN.   LABORATORY DATA:  CBC is remarkable for a stable decreased hemoglobin of  9.4.  The MCV is at 97.5, platelets are normal at 274.  INR is 1.  Chem  panel is remarkable for a slight elevated SGOT of 46, but the SGPT is 28.  Albumin is still decreased at 2.7.  Ammonia was 12. Amylase was elevated at  218.  Lipase was elevated at 111 upon admission.  TSH was normal.  B12 was  769.  Folate was 158.  A.M. Cortisol was 18.8.  PSA was within normal  limits.  Hepatitis panel was negative.  Urine drug screen was positive for  benzodiazepines.  Alcohol was negative on admission.  RPR nonreactive.   REVIEW OF SYSTEMS:  CONSTITUTIONAL:  Afebrile.  HEAD:  No trauma.  EYES:  No  visual changes.  EARS:  No hearing impairment.  NEUROLOGIC:  Unremarkable.  CARDIOVASCULAR:  No chest pain, palpitations, or edema.  RESPIRATORY:  No  cough, shortness of breath, or wheezing.  GASTROINTESTINAL:  No nausea,  vomiting, or diarrhea.  GENITOURINARY:  No dysuria.  SKIN:  Unremarkable.  MUSCULOSKELETAL:  No deformities or atrophy.  However, the patient does  continue to be weak and requires a walker to ambulate.  PSYCHIATRIC:  As  above.  On the day of admission, Mr. Rising' orientation has returned.  His short-term recall is now intact.  His mood is normal with interest in  the future.  There are no thoughts of harming himself.  No thoughts of  harming others.  His energy is still decreased, but he has positive goals  for the future and his appetite is normal.  ENDOCRINE/METABOLIC:  Unremarkable.   PHYSICAL EXAMINATION:  VITAL SIGNS:  Temperature 98.8, pulse 94,  respirations 20, blood pressure 154/100, O2 saturation on room air is 93.  MENTAL STATUS:   John Leon is an alert, middle-aged male sitting in the  supine position partially reclined in his hospital bed.  His fund of  knowledge and intelligence is above average.  His eye contact is good.  Affect is mildly flat with a broad, appropriate response.  Mood is within  normal limits.  Thought process is logical, coherent, and goal directed.  No  looseness of associations or thought content.  No thoughts of harming  himself.  No thoughts of harming others.  No delusions.  No hallucinations.  MEMORY:  There are some gaps in his memory concerning the delirium periods  last week on specific testing.  Now, he is able to recall events of the past  couple of days, 3 out of 3 immediate as well as 3 out of 3 at 5 minutes.  Concentration is grossly intact.  Judgment is intact.  Insight is partial in  that he recognizes a need for sobriety. On orientation, he is intact to the  year, the month, the day of the month, day of the week, time, hospital, and  person.  He did mention that the city was [place] instead of  [place].  However, he realized his incorrect statement later in the  interview.   ASSESSMENT:  AXIS I:  1.  Delirium not otherwise specified 293.00 (this may have been alcohol      withdrawal delirium, however, there were other general medical factors      that had developed that were secondary to alcoholic lifestyle that      appeared to have contributed to his delirium) resolved.  2.  Alcohol with physiologic dependence now resolved.  3.  Mood disorder, not otherwise specified (the patient had some depressive      symptoms that are now resolved except for weakness.  These symptoms      appear to have been due to the reaction psychosocially that the patient      was having with his alcoholism as well as general medical contributions.      The depression is now resolved except for his general medical weakness     which is improving).  AXIS II:  Deferred.  AXIS III:  See  general medical problems.  AXIS IV:  Primary support group occupational.  AXIS V:   The patient  gave permission for his two sons and brother to be in the room  as a way to facilitate support.  The patient is not at risk of harming  himself or others.  He agrees to use emergency services for any psychiatric  emergency symptoms.  The patient has returned to standard functioning in the  following areas:  He has the ability to make a consistent choice, the  ability to differentiate between options, risks, benefits, the ability to  appreciate his risks and his medical problems, and the ability to reason  well.  The patient does have the capacity for informed consent.  It was  recommended to the patient that he enter an inpatient chemical dependency  rehabilitation program.  However, the patient declined and he is not  committable.  He opts for CDIOP (chemical dependency intensive outpatient  program).   RECOMMENDATIONS:  1.  Continue the patient on B1 50-100 mg p.o. q.a.m. and folate as well as a      multivitamin per day.  The patient      was introduced to the 12-step alcoholics anonymous method.  He would      like to attend meetings.  2.  The patient would like to attend a chemical dependency intensive      outpatient program.  The case manager can call for one of these programs      at [facility] or [facility].      Antonietta Breach, M.D.  Electronically Signed     JW/MEDQ  D:  11/13/2005  T:  11/13/2005  Job:  [id number]

## 2010-09-16 NOTE — H&P (Signed)
NAME:  John Leon, John Leon NO.:  [id number]   MEDICAL RECORD NO.:  [id number]          PATIENT TYPE:  INP   LOCATION:  0454                         FACILITY:  [facility]   PHYSICIAN:  Lonia Blood, M.D.       DATE OF BIRTH:  1951/01/06   DATE OF ADMISSION:  11/05/2005  DATE OF DISCHARGE:                                HISTORY & PHYSICAL   PRIMARY CARE PHYSICIAN:  No one.   HISTORY OF CHIEF COMPLAINT:  Altered mental status.   HISTORY OF PRESENT ILLNESS:  John Leon is a 59 year old gentleman with  heavy alcohol abuse who was brought into the emergency room by his family  because he has been failing to thrive for the past 7 months.  Apparently,  the patient has been having frequent falls at home.  He has been constantly  drinking and not eating and recently they have noticed that he has become  increasingly confused and unable to care for himself.   PAST MEDICAL HISTORY:  Nothing.   HOME MEDICATIONS:  None.   SOCIAL HISTORY:  The patient is single.  He lives alone.  He is a Ambulance person.  He drinks a gallon of vodka a week.  Smokes two pack of  cigarettes a day.  Does not use any illicit drugs.  The patient has two  sons, one daughter and a brother.   REVIEW OF SYSTEMS:  Per the patient, he does not have any headaches, does  not have any chest pain, does not have shortness of breath, has no abdominal  pain.  He reports some nausea and vomiting.  He also reports some feeling of  weakness but not too important for him.   PHYSICAL EXAMINATION:  VITAL SIGNS:  Upon admission, temperature 98.4, blood  pressure 82/52, pulse 64, respirations 18, saturation 99% on room air.  GENERAL APPEARANCE:  A cachectic white man in no acute distress lying on  stretcher.  He is not oriented to place, person, time and situation.  He is  currently not agitated, calm and cooperative but extremely confused.  He is  able to follow commands and answer questions but I feel like  at times he is  confabulating.  HEENT:  Head is normocephalic, atraumatic.  Eyes have pupils equal, round,  react to light and accommodation.  There is a mild scleral icterus.  Conjunctivae are pink.  The patient has nystagmus.  Throat is clear.  NECK:  Supple without JVD, no carotid bruits.  CHEST:  Clear to auscultation without wheezes, rhonchi or crackles.  HEART:  Regular rate and rhythm without murmurs, rubs or gallops.  The  patient abdomen is soft.  There is some mild epigastric tenderness.  There  is an obvious hepatomegaly with irregular contour.  EXTREMITIES:  Have signs of frequent falls with excoriations of the skin and  some bruises.  There is no edema.  Pulses are present.  There is decreased  sensation in the lower extremity bilaterally.  Deep tendon reflexes are  decreased in the lower extremities bilaterally.   LABORATORY VALUES:  At the time of  admission, the sodium is 132, potassium  less than 2, chloride 73, bicarb 44, glucose 93, BUN 14, creatinine 0.8,  calcium 7.7, total protein 6.1, albumin 3, AST 58, ALT 23, alkaline  phosphatase 83, total bilirubin 1.5, lipase 111, ammonia 93, amylase 218, PT  is 12.6, INR is 0.9.  White blood cell count 10.8, hemoglobin 11.8,  hematocrit 34.2, platelet count 275.  Alcohol level less than 5.  Portable  chest x-ray shows a possible right lower lobe infiltrate.   ASSESSMENT/PLAN:  1.  Severe hypokalemia most likely related to nausea, vomiting, severe      alcohol abuse most likely also related to hypomagnesemia:  Plan is to      admit the patient to telemetry and replace his potassium and magnesium      aggressively.  2.  Mild acute pancreatitis:  Will keep the patient on a clear liquid diet      and treat him intravenous fluids.  3.  Severe dehydration and cachexia:  Will start the patient intravenous      fluids and evaluate him with a nutrition consult.  4.  Altered mental status:  This alteration in mental status is  probably a      combination of hepatic encephalopathy, alcohol withdrawal and a degree      of Wernicke, possible course of dementia.  The patient will be started      on intravenous thiamine and folic acid and he will be observed closely      in the hospital.  5.  Alcoholism with incipient alcohol withdrawal:  Will place the patient on      Ativan protocol.  6.  Tobacco abuse:  Will start the patient on a nicotine patch and counsel      him for smoking cessation.  7.  Elevated AST and sodium bilirubin:  This is possible alcoholic hepatitis      versus already cirrhosis.  Will evaluate the patient closely with an      abdominal ultrasound and follow his labs closely.      Lonia Blood, M.D.  Electronically Signed     SL/MEDQ  D:  11/05/2005  T:  11/05/2005  Job:  [id number]

## 2010-09-16 NOTE — Discharge Summary (Signed)
NAME:  John Leon, John Leon             ACCOUNT NO.:  [id number]   MEDICAL RECORD NO.:  [id number]          PATIENT TYPE:  INP   LOCATION:  6738                         FACILITY:  [facility]   PHYSICIAN:  Jonna L. Robb Matar, M.D.DATE OF BIRTH:  [birth year]   DATE OF ADMISSION:  11/05/2005  DATE OF DISCHARGE:  11/14/2005                                 DISCHARGE SUMMARY   PRIMARY CARE PHYSICIAN:  Unassigned.   ALLERGIES:  PENICILLIN.   CODE STATUS:  Full.   FINAL DIAGNOSES:  1.  Acute altered mental status, possible Korsakov.  2.  Hepatic encephalopathy.  3.  Acute pancreatitis.  4.  Dehydration.  5.  Alcoholic polyneuropathy.  6.  Delirium tremens.  7.  Hypokalemia.  8.  Hypomagnesemia.   For other details, please see the discharge summary of Dr. Michaelyn Barter  on July 15.  The patient continued to improve over those last few days,  became stronger and better able to walk.  His appetite has returned and  overall it was felt that he would benefit from a 28-day inpatient stay.  By  the time of discharge, the patient is able to walk with the use of a walker  and his electrolytes had improved.   DISPOSITION:  1.  The patient will be discharged on Nicoderm 21 mg patch daily for two      weeks and a 14 mg patch daily for two weeks, then 7 mg patch following.  2.  He will be on Lactulose 10 mL t.i.d.  3.  Multivitamin with minerals daily.  4.  One Tums b.i.d.  5.  Protonix 40 daily.  6.  Folic acid 1 mg daily.  7.  Thiamin 100 mg daily.  8.  Magnesium oxide 800 mg b.i.d.  9.  Norvasc 5 mg daily.  10. Ensure liquid supplement t.i.d. if he is not eating well.  11. Reglan 5 q.6 h p.r.n.   He is to use a rolling walker until he is more steady on his feet.  He is  medically cleared.      Jonna L. Robb Matar, M.D.  Electronically Signed     JLB/MEDQ  D:  11/14/2005  T:  11/14/2005  Job:  [id number]

## 2010-10-03 ENCOUNTER — Other Ambulatory Visit: Payer: Self-pay | Admitting: Cardiology

## 2010-11-09 ENCOUNTER — Other Ambulatory Visit: Payer: Self-pay | Admitting: *Deleted

## 2010-11-09 MED ORDER — CLOPIDOGREL BISULFATE 75 MG PO TABS: 75.0000 mg | ORAL_TABLET | Freq: Every day | ORAL | Status: DC

## 2010-11-15 ENCOUNTER — Encounter: Payer: Self-pay | Admitting: Cardiology

## 2010-12-13 ENCOUNTER — Encounter: Payer: Self-pay | Admitting: Cardiology

## 2010-12-13 ENCOUNTER — Ambulatory Visit (INDEPENDENT_AMBULATORY_CARE_PROVIDER_SITE_OTHER): Payer: BC Managed Care – PPO | Admitting: Cardiology

## 2010-12-13 DIAGNOSIS — I2510 Atherosclerotic heart disease of native coronary artery without angina pectoris: Secondary | ICD-10-CM

## 2010-12-13 DIAGNOSIS — I428 Other cardiomyopathies: Secondary | ICD-10-CM

## 2010-12-13 DIAGNOSIS — F17200 Nicotine dependence, unspecified, uncomplicated: Secondary | ICD-10-CM

## 2010-12-13 NOTE — Assessment & Plan Note (Signed)
He does not want me to talk to you about tobacco. I will defer the lectures to Dr. Christell Constant.  He understands the need to stop smoking.

## 2010-12-13 NOTE — Progress Notes (Signed)
HPI The patient presents for one year follow up.  Since I last saw him he has done well. The patient denies any new symptoms such as chest discomfort, neck or arm discomfort. There has been no new shortness of breath, PND or orthopnea. There have been no reported palpitations, presyncope or syncope.  He does still smoke.  He is five years without drinking now.  Allergies  Allergen Reactions  . Penicillins     REACTION: Reaction not known    Current Outpatient Prescriptions  Medication Sig Dispense Refill  . aspirin 81 MG tablet Take 81 mg by mouth daily.        . carvedilol (COREG) 3.125 MG tablet TAKE 1 TABLET BY MOUTH TWICE A DAY  60 tablet  10  . Cholecalciferol (VITAMIN D3) 2000 UNITS TABS Take 1 tablet by mouth daily.        . clopidogrel (PLAVIX) 75 MG tablet Take 1 tablet (75 mg total) by mouth daily.  30 tablet  5  . lisinopril (PRINIVIL,ZESTRIL) 2.5 MG tablet TAKE 1 TABLET DAILY WITH 5 MG TABLET FOR A TOTAL OF 7.5 MG DAILY  30 tablet  11  . lisinopril (PRINIVIL,ZESTRIL) 5 MG tablet Take 7.5 mg by mouth daily.        . niacin (NIASPAN) 500 MG CR tablet Take 500 mg by mouth at bedtime.        . nitroGLYCERIN (NITROSTAT) 0.4 MG SL tablet Place 0.4 mg under the tongue every 5 (five) minutes as needed.        . rosuvastatin (CRESTOR) 20 MG tablet Take 20 mg by mouth daily.          Past Medical History  Diagnosis Date  . Hyperlipidemia   . H/O: alcohol abuse     now sober for 35 months  . Coronary artery disease     s/p DES to LAD, occluded RCA  . Ischemic cardiomyopathy   . Post-infarction apical thrombus     Past Surgical History  Procedure Date  . None     ROS:  As stated in the HPI and negative for all other systems.  PHYSICAL EXAM BP 102/68  Pulse 57  Resp 16  Ht 5\' 9"  (1.753 m)  Wt 159 lb (72.122 kg)  BMI 23.48 kg/m2 GENERAL:  Well appearing HEENT:  Pupils equal round and reactive, fundi not visualized, oral mucosa unremarkable NECK:  No jugular venous  distention, waveform within normal limits, carotid upstroke brisk and symmetric, no bruits, no thyromegaly LYMPHATICS:  No cervical, inguinal adenopathy LUNGS:  Clear to auscultation bilaterally BACK:  No CVA tenderness CHEST:  Unremarkable HEART:  PMI not displaced or sustained,S1 and S2 within normal limits, no S3, no S4, no clicks, no rubs, no murmurs ABD:  Flat, positive bowel sounds normal in frequency in pitch, no bruits, no rebound, no guarding, no midline pulsatile mass, no hepatomegaly, no splenomegaly EXT:  2 plus pulses throughout, no edema, no cyanosis no clubbing SKIN:  No rashes no nodules NEURO:  Cranial nerves II through XII grossly intact, motor grossly intact throughout PSYCH:  Cognitively intact, oriented to person place and time  EKG:  Sinus bradycardia, rate 57, old inferior infarct, lateral T-wave inversions unchanged from previous.   ASSESSMENT AND PLAN

## 2010-12-13 NOTE — Assessment & Plan Note (Signed)
I have no reason to suspect that his EF is lower than previous. He will continue the meds as listed.

## 2010-12-13 NOTE — Assessment & Plan Note (Signed)
The patient has no new sypmtoms.  No further cardiovascular testing is indicated.  We will continue with aggressive risk reduction and meds as listed.  

## 2011-05-12 ENCOUNTER — Other Ambulatory Visit: Payer: Self-pay | Admitting: Cardiovascular Disease

## 2011-05-12 ENCOUNTER — Other Ambulatory Visit: Payer: Self-pay | Admitting: Cardiology

## 2011-09-14 ENCOUNTER — Other Ambulatory Visit: Payer: Self-pay | Admitting: Cardiology

## 2011-11-16 ENCOUNTER — Other Ambulatory Visit: Payer: Self-pay | Admitting: Cardiology

## 2011-11-16 NOTE — Telephone Encounter (Signed)
Fax Received. Refill Completed. John Leon (R.M.A)  Pt needs appointment then refill can be made  

## 2011-11-19 ENCOUNTER — Other Ambulatory Visit: Payer: Self-pay | Admitting: Cardiology

## 2012-01-23 ENCOUNTER — Other Ambulatory Visit: Payer: Self-pay | Admitting: Cardiology

## 2012-01-23 NOTE — Telephone Encounter (Signed)
..   Requested Prescriptions   Pending Prescriptions Disp Refills  . clopidogrel (PLAVIX) 75 MG tablet [Pharmacy Med Name: CLOPIDOGREL 75 MG TABLET] 30 tablet 3    Sig: TAKE 1 TABLET EVERY DAY  .Marland KitchenPatient needs to contact office to schedule  Appointment  for future refills.Ph:[phone]. Thank you.

## 2012-03-21 ENCOUNTER — Other Ambulatory Visit: Payer: Self-pay | Admitting: Cardiology

## 2012-05-31 ENCOUNTER — Other Ambulatory Visit: Payer: Self-pay | Admitting: Cardiovascular Disease

## 2012-05-31 NOTE — Telephone Encounter (Signed)
Called pt left VM informing him I could only refill his Rx for 30 days and if he could call [phone] to make a follow up for additional refills.  Caralee Ates, CMA

## 2012-07-17 ENCOUNTER — Other Ambulatory Visit: Payer: Self-pay | Admitting: Cardiology

## 2012-07-22 ENCOUNTER — Other Ambulatory Visit: Payer: Self-pay | Admitting: *Deleted

## 2012-07-22 ENCOUNTER — Telehealth: Payer: Self-pay | Admitting: Family Medicine

## 2012-07-22 MED ORDER — CLOPIDOGREL BISULFATE 75 MG PO TABS: 75.0000 mg | ORAL_TABLET | Freq: Every day | ORAL | Status: DC

## 2012-07-22 MED ORDER — LISINOPRIL 2.5 MG PO TABS: 5.0000 mg | ORAL_TABLET | Freq: Every day | ORAL | Status: DC

## 2012-07-22 NOTE — Telephone Encounter (Signed)
Patient's insurance requires prior auth for [employer].

## 2012-07-22 NOTE — Telephone Encounter (Signed)
Pt request is in prior authorizations

## 2012-07-23 NOTE — Telephone Encounter (Signed)
Prior auth in process by Carren Rang

## 2012-07-26 ENCOUNTER — Telehealth: Payer: Self-pay | Admitting: *Deleted

## 2012-07-26 MED ORDER — PRAVASTATIN SODIUM 40 MG PO TABS: 40.0000 mg | ORAL_TABLET | Freq: Every day | ORAL | Status: DC

## 2012-07-26 NOTE — Telephone Encounter (Signed)
[facility] will not cover crestor because he has not taken simvastatin, lovastatin, prevastatin or atorvastatin.  Must have failed behavioral changes and one  of these statins.

## 2012-07-26 NOTE — Telephone Encounter (Signed)
Try pravastatin  40 one daily liver function tests one month after starting

## 2012-08-21 ENCOUNTER — Other Ambulatory Visit: Payer: Self-pay | Admitting: Cardiology

## 2012-09-10 ENCOUNTER — Other Ambulatory Visit: Payer: Self-pay | Admitting: Cardiology

## 2012-09-11 NOTE — Telephone Encounter (Signed)
Spoke to pharmacist and they stated they will inform pt he need to call office to make appointment. Pt was last seen in 2012. Fax Received. Refill Completed. John Leon (R.M.A). Could not reach pt, no main number. Pt Need appointment

## 2012-09-13 NOTE — Telephone Encounter (Signed)
Pt is aware of medication  And is ok with change.

## 2012-09-24 ENCOUNTER — Other Ambulatory Visit: Payer: Self-pay | Admitting: Cardiology

## 2012-09-25 NOTE — Telephone Encounter (Signed)
..   Requested Prescriptions   Pending Prescriptions Disp Refills  . clopidogrel (PLAVIX) 75 MG tablet [Pharmacy Med Name: CLOPIDOGREL 75 MG TABLET] 30 tablet 0    Sig: TAKE 1 TABLET (75 MG TOTAL) BY MOUTH DAILY.

## 2012-10-16 ENCOUNTER — Other Ambulatory Visit (INDEPENDENT_AMBULATORY_CARE_PROVIDER_SITE_OTHER): Payer: BC Managed Care – PPO

## 2012-10-16 ENCOUNTER — Encounter: Payer: Self-pay | Admitting: *Deleted

## 2012-10-16 DIAGNOSIS — Z79899 Other long term (current) drug therapy: Secondary | ICD-10-CM

## 2012-10-16 DIAGNOSIS — R5381 Other malaise: Secondary | ICD-10-CM

## 2012-10-16 DIAGNOSIS — Z125 Encounter for screening for malignant neoplasm of prostate: Secondary | ICD-10-CM

## 2012-10-16 DIAGNOSIS — E785 Hyperlipidemia, unspecified: Secondary | ICD-10-CM

## 2012-10-16 DIAGNOSIS — I10 Essential (primary) hypertension: Secondary | ICD-10-CM

## 2012-10-16 LAB — HEPATIC FUNCTION PANEL
Albumin: 4.2 g/dL (ref 3.5–5.2)
Total Bilirubin: 0.6 mg/dL (ref 0.3–1.2)
Total Protein: 6.5 g/dL (ref 6.0–8.3)

## 2012-10-17 ENCOUNTER — Other Ambulatory Visit: Payer: Self-pay | Admitting: Nurse Practitioner

## 2012-10-17 LAB — NMR LIPOPROFILE WITH LIPIDS
HDL Size: 8.7 nm — ABNORMAL LOW (ref >=9.2)
HDL-C: 30 mg/dL — ABNORMAL LOW (ref >=40)
LDL Particle Number: 1341 nmol/L — ABNORMAL HIGH (ref <1000)
LDL Size: 20.2 nm — ABNORMAL LOW (ref >20.5)
Triglycerides: 85 mg/dL (ref <150)
VLDL Size: 45.6 nm (ref <=46.6)

## 2012-10-17 NOTE — Telephone Encounter (Signed)
error 

## 2012-10-18 ENCOUNTER — Other Ambulatory Visit: Payer: Self-pay | Admitting: Family Medicine

## 2012-10-18 ENCOUNTER — Telehealth: Payer: Self-pay | Admitting: *Deleted

## 2012-10-18 NOTE — Telephone Encounter (Signed)
Message copied by Magdalene River on Fri Oct 18, 2012  9:09 AM ------      Message from: Ernestina Penna      Created: Thu Oct 17, 2012  7:12 PM       All. liver function tests within normal limits      On advanced lipid testing a total LDL particle number is elevated at 1341, E. LDL C. is slightly elevated, triglycerides are good, the HDL particle number is low------- confirm current treatment name dose, and that he is taking it correctly. Confirm aggressive therapeutic lifestyle changes.       ------

## 2012-10-18 NOTE — Telephone Encounter (Signed)
Pt called about labs - will see DWM on tues- will discuss then- only been on pravastatin for about a month- will stay on this and rck in 3 mo- labs

## 2012-10-22 ENCOUNTER — Encounter: Payer: Self-pay | Admitting: Family Medicine

## 2012-10-22 ENCOUNTER — Ambulatory Visit (INDEPENDENT_AMBULATORY_CARE_PROVIDER_SITE_OTHER): Payer: BC Managed Care – PPO | Admitting: Family Medicine

## 2012-10-22 VITALS — BP 132/80 | HR 46 | Temp 98.6°F | Ht 69.5 in | Wt 164.0 lb

## 2012-10-22 DIAGNOSIS — E7800 Pure hypercholesterolemia, unspecified: Secondary | ICD-10-CM

## 2012-10-22 DIAGNOSIS — I428 Other cardiomyopathies: Secondary | ICD-10-CM

## 2012-10-22 DIAGNOSIS — I238 Other current complications following acute myocardial infarction: Secondary | ICD-10-CM

## 2012-10-22 DIAGNOSIS — I2510 Atherosclerotic heart disease of native coronary artery without angina pectoris: Secondary | ICD-10-CM

## 2012-10-22 LAB — THYROID PANEL WITH TSH
Free Thyroxine Index: 3.3 (ref 1.0–3.9)
T3 Uptake: 29.3 % (ref 22.5–37.0)

## 2012-10-22 NOTE — Progress Notes (Signed)
  Subjective:    Patient ID: John Leon, male    DOB: February 20, 1951, 62 y.o.   MRN: [id number]  HPI The patient returns today for followup of several chronic problems. He sees Dr. cardiologist once yearly. He has no particular complaints as the review of systems indicates other than arthralgias in his hands and he is an Airline pilot.   Review of Systems  Constitutional: Negative.   HENT: Negative.   Eyes: Negative.   Cardiovascular: Negative.   Gastrointestinal: Negative.   Genitourinary: Negative.   Musculoskeletal: Positive for arthralgias (hands, cramping).  Skin: Negative.   Allergic/Immunologic: Negative.   Neurological: Negative.   Hematological: Negative.   Psychiatric/Behavioral: Negative.        Objective:   Physical Exam  Vitals reviewed. Constitutional: He is oriented to person, place, and time. He appears well-developed and well-nourished. No distress.  HENT:  Head: Normocephalic and atraumatic.  Right Ear: External ear normal.  Left Ear: External ear normal.  Nose: Nose normal.  Mouth/Throat: Oropharynx is clear and moist. No oropharyngeal exudate.  Cerumen in right external auditory canal  Eyes: Conjunctivae are normal. Right eye exhibits no discharge. Left eye exhibits no discharge. No scleral icterus.  Recent eye exam and everything was good.  Neck: Normal range of motion. Neck supple. Thyromegaly present.  Cardiovascular: Normal rate, regular rhythm and normal heart sounds.  Exam reveals no gallop and no friction rub.   No murmur heard. At 60 per minute  Pulmonary/Chest: Effort normal. No respiratory distress. He has wheezes. He has no rales.  Few scattered wheezes on inspiration bilaterally. He is still smoking  Abdominal: Soft. Bowel sounds are normal. He exhibits no distension and no mass. There is no tenderness. There is no rebound and no guarding.  Genitourinary: Penis normal.  Prostate slightly enlarged but smooth. No rectal masses. No hernias felt  bilaterally and testicles were normal  Musculoskeletal: Normal range of motion. He exhibits no edema and no tenderness.  Lymphadenopathy:    He has no cervical adenopathy.  Neurological: He is alert and oriented to person, place, and time. He has normal reflexes.  Skin: Skin is warm and dry. No rash noted. No erythema. No pallor.  Psychiatric: He has a normal mood and affect. His behavior is normal. Judgment and thought content normal.          Assessment & Plan:  1. HYPERCHOLESTEROLEMIA-PURE -Continue pravastatin and aggressive therapeutic lifestyle changes  2. CARDIOMYOPATHY -Continue followup with Dr. Antoine Poche yearly  3. CAD  4. MURAL THROMBUS -Continue blood thinner  5.BPH  6. Ear cerumen  Patient Instructions  Continue aggressive therapeutic lifestyle changes to help lower cholesterol he was more. This includes exercise as well as.and medication. Drink plenty of fluid De Brox over-the-counter to help remove ear wax in the right ear canal Be careful about climbing and the risk of falling Return to clinic in 6 months to followup cholesterol and get a CBC at that time Bring back FOBT

## 2012-10-22 NOTE — Patient Instructions (Addendum)
Continue aggressive therapeutic lifestyle changes to help lower cholesterol he was more. This includes exercise as well as.and medication. Drink plenty of fluid De Brox over-the-counter to help remove ear wax in the right ear canal Be careful about climbing and the risk of falling Return to clinic in 6 months to followup cholesterol and get a CBC at that time Bring back FOBT

## 2012-10-23 LAB — BASIC METABOLIC PANEL WITH GFR
BUN: 10 mg/dL (ref 6–23)
Calcium: 9.2 mg/dL (ref 8.4–10.5)
GFR, Est African American: >89 mL/min
GFR, Est Non African American: 78 mL/min
Potassium: 4.6 mEq/L (ref 3.5–5.3)
Sodium: 137 mEq/L (ref 135–145)

## 2012-10-23 LAB — PSA: PSA: 0.78 ng/mL (ref <=4.00)

## 2012-10-24 ENCOUNTER — Other Ambulatory Visit: Payer: Self-pay | Admitting: Cardiology

## 2012-10-29 ENCOUNTER — Telehealth: Payer: Self-pay

## 2012-10-29 NOTE — Telephone Encounter (Signed)
Pharm called for refill on Plavix 75 give a 30 day 0 refill patient needs an appointment

## 2012-11-20 ENCOUNTER — Other Ambulatory Visit: Payer: Self-pay | Admitting: Family Medicine

## 2012-11-27 ENCOUNTER — Other Ambulatory Visit: Payer: Self-pay | Admitting: Cardiology

## 2012-11-29 ENCOUNTER — Other Ambulatory Visit: Payer: Self-pay | Admitting: *Deleted

## 2012-11-29 MED ORDER — LISINOPRIL 2.5 MG PO TABS: ORAL_TABLET | ORAL | Status: DC

## 2012-11-29 MED ORDER — LISINOPRIL 5 MG PO TABS: ORAL_TABLET | ORAL | Status: DC

## 2012-12-03 ENCOUNTER — Other Ambulatory Visit: Payer: Self-pay | Admitting: Family Medicine

## 2012-12-04 ENCOUNTER — Other Ambulatory Visit: Payer: Self-pay | Admitting: *Deleted

## 2012-12-04 MED ORDER — CLOPIDOGREL BISULFATE 75 MG PO TABS: ORAL_TABLET | ORAL | Status: DC

## 2012-12-04 NOTE — Telephone Encounter (Signed)
Patient was notified at last refill by cardiologist that he needed a follow up appt. Has not been done. Pharmacy sent request for refill. Should we fill or should cardiologist fill? Please advise

## 2012-12-11 ENCOUNTER — Encounter: Payer: Self-pay | Admitting: Cardiology

## 2012-12-11 ENCOUNTER — Ambulatory Visit (INDEPENDENT_AMBULATORY_CARE_PROVIDER_SITE_OTHER): Payer: BC Managed Care – PPO | Admitting: Cardiology

## 2012-12-11 VITALS — BP 128/85 | HR 64 | Ht 69.5 in | Wt 165.0 lb

## 2012-12-11 DIAGNOSIS — I428 Other cardiomyopathies: Secondary | ICD-10-CM

## 2012-12-11 DIAGNOSIS — I2510 Atherosclerotic heart disease of native coronary artery without angina pectoris: Secondary | ICD-10-CM

## 2012-12-11 DIAGNOSIS — F17200 Nicotine dependence, unspecified, uncomplicated: Secondary | ICD-10-CM

## 2012-12-11 NOTE — Patient Instructions (Addendum)
Your physician has requested that you have an echocardiogram DX ISCHEMIC CARDIOMYOPATHY 12/17/12 3 PM..Marland KitchenEchocardiography is a painless test that uses sound waves to create images of your heart. It provides your doctor with information about the size and shape of your heart and how well your heart's chambers and valves are working. This procedure takes approximately one hour. There are no restrictions for this procedure.  Your physician wants you to follow-up in: 12 YEAR FOLLOW UP WITH DR. HOCHREIN You will receive a reminder letter in the mail two months in advance. If you don't receive a letter, please call our office to schedule the follow-up appointment.  NO CHANGES WERE MADE TODAY

## 2012-12-11 NOTE — Progress Notes (Signed)
HPI The patient presents for follow up of CAD.  Since I last saw him he has done well. The patient denies any new symptoms such as chest discomfort, neck or arm discomfort. There has been no new shortness of breath, PND or orthopnea. There have been no reported palpitations, presyncope or syncope.  He does still smoke.  He has been alcohol free for 85 months.  He remains active although he doesn't exercise routinely.  Allergies  Allergen Reactions  . Penicillins     REACTION: Reaction not known    Current Outpatient Prescriptions  Medication Sig Dispense Refill  . aspirin 81 MG tablet Take 81 mg by mouth daily.        . carvedilol (COREG) 3.125 MG tablet TAKE 1 TABLET BY MOUTH TWICE A DAY  60 tablet  1  . Cholecalciferol (VITAMIN D3) 2000 UNITS TABS Take 1 tablet by mouth daily.        . clopidogrel (PLAVIX) 75 MG tablet TAKE 1 TABLET (75 MG TOTAL) BY MOUTH DAILY.  30 tablet  0  . lisinopril (PRINIVIL,ZESTRIL) 2.5 MG tablet TAKE 1 TABLET DAILY WITH 5 MG TABLET FOR A TOTAL OF 7.5 MG DAILY  30 tablet  2  . lisinopril (PRINIVIL,ZESTRIL) 5 MG tablet TAKE ONE TABLET BY MOUTH DAILY ALONG WITH 2.5 MG.  30 tablet  2  . nitroGLYCERIN (NITROSTAT) 0.4 MG SL tablet Place 0.4 mg under the tongue every 5 (five) minutes as needed.        . pravastatin (PRAVACHOL) 40 MG tablet TAKE 1 TABLET (40 MG TOTAL) BY MOUTH DAILY.  30 tablet  1   No current facility-administered medications for this visit.    Past Medical History  Diagnosis Date  . Hyperlipidemia   . H/O: alcohol abuse     now sober for 35 months  . Coronary artery disease     s/p DES to LAD, occluded RCA  . Ischemic cardiomyopathy   . Post-infarction apical thrombus     Past Surgical History  Procedure Laterality Date  . None      ROS:  As stated in the HPI and negative for all other systems.  PHYSICAL EXAM BP 128/85  Pulse 64  Ht 5' 9.5" (1.765 m)  Wt 165 lb (74.844 kg)  BMI 24.03 kg/m2 GENERAL:  Well appearing HEENT:   Pupils equal round and reactive, fundi not visualized, oral mucosa unremarkable NECK:  No jugular venous distention, waveform within normal limits, carotid upstroke brisk and symmetric, no bruits, no thyromegaly LYMPHATICS:  No cervical, inguinal adenopathy LUNGS:  Clear to auscultation bilaterally BACK:  No CVA tenderness CHEST:  Unremarkable HEART:  PMI not displaced or sustained,S1 and S2 within normal limits, no S3, no S4, no clicks, no rubs, no murmurs ABD:  Flat, positive bowel sounds normal in frequency in pitch, no bruits, no rebound, no guarding, no midline pulsatile mass, no hepatomegaly, no splenomegaly EXT:  2 plus pulses throughout, no edema, no cyanosis no clubbing SKIN:  No rashes no nodules NEURO:  Cranial nerves II through XII grossly intact, motor grossly intact throughout PSYCH:  Cognitively intact, oriented to person place and time  EKG:  Sinus bradycardia, rate 64, old inferior infarct, lateral T-wave inversions unchanged from previous.   12/11/2012  ASSESSMENT AND PLAN  CAD:  The patient has no new symptoms. I will likely bring him back for treadmill testing in the future for routine screening. Because of his ongoing tobacco abuse I will continue his Plavix and  other meds as listed. He is at higher risk for future cardiovascular events with this ongoing risk factor and he and I have discussed this at length.  TOBACCO ABUSE:  We discussed the need to stop smoking but he says he will be unable to do this. He does not want further medical therapy.  ISCHEMIC CARDIOMYOPATHY:  I will check an echocardiogram to followup his mildly reduced ejection fraction as this will guide further medication therapy.  DYSLIPIDEMIA:  This is followed and managed closely by Dr. Christell Constant.

## 2012-12-17 ENCOUNTER — Ambulatory Visit (HOSPITAL_COMMUNITY): Payer: BC Managed Care – PPO | Attending: Cardiology

## 2012-12-17 DIAGNOSIS — E785 Hyperlipidemia, unspecified: Secondary | ICD-10-CM | POA: Insufficient documentation

## 2012-12-17 DIAGNOSIS — I2510 Atherosclerotic heart disease of native coronary artery without angina pectoris: Secondary | ICD-10-CM | POA: Insufficient documentation

## 2012-12-17 DIAGNOSIS — I428 Other cardiomyopathies: Secondary | ICD-10-CM

## 2012-12-17 DIAGNOSIS — F17200 Nicotine dependence, unspecified, uncomplicated: Secondary | ICD-10-CM | POA: Insufficient documentation

## 2012-12-17 DIAGNOSIS — I2589 Other forms of chronic ischemic heart disease: Secondary | ICD-10-CM

## 2012-12-17 NOTE — Progress Notes (Signed)
Echocardiogram performed.  

## 2012-12-29 ENCOUNTER — Other Ambulatory Visit: Payer: Self-pay | Admitting: Cardiology

## 2013-01-09 ENCOUNTER — Other Ambulatory Visit: Payer: Self-pay | Admitting: Family Medicine

## 2013-02-07 ENCOUNTER — Other Ambulatory Visit: Payer: Self-pay | Admitting: Family Medicine

## 2013-02-18 ENCOUNTER — Other Ambulatory Visit: Payer: Self-pay | Admitting: Cardiology

## 2013-03-11 ENCOUNTER — Other Ambulatory Visit: Payer: Self-pay | Admitting: Family Medicine

## 2013-03-17 ENCOUNTER — Other Ambulatory Visit: Payer: Self-pay | Admitting: Cardiology

## 2013-04-17 ENCOUNTER — Other Ambulatory Visit: Payer: Self-pay | Admitting: Family Medicine

## 2013-04-21 NOTE — Telephone Encounter (Signed)
Last seen 10/22/12  DWM

## 2013-04-28 ENCOUNTER — Encounter: Payer: Self-pay | Admitting: Family Medicine

## 2013-04-28 ENCOUNTER — Ambulatory Visit (INDEPENDENT_AMBULATORY_CARE_PROVIDER_SITE_OTHER): Payer: BC Managed Care – PPO

## 2013-04-28 ENCOUNTER — Ambulatory Visit (INDEPENDENT_AMBULATORY_CARE_PROVIDER_SITE_OTHER): Payer: BC Managed Care – PPO | Admitting: Family Medicine

## 2013-04-28 VITALS — BP 115/72 | HR 59 | Temp 99.5°F | Ht 69.5 in | Wt 159.0 lb

## 2013-04-28 DIAGNOSIS — E7800 Pure hypercholesterolemia, unspecified: Secondary | ICD-10-CM

## 2013-04-28 DIAGNOSIS — I10 Essential (primary) hypertension: Secondary | ICD-10-CM

## 2013-04-28 DIAGNOSIS — I2510 Atherosclerotic heart disease of native coronary artery without angina pectoris: Secondary | ICD-10-CM

## 2013-04-28 DIAGNOSIS — E559 Vitamin D deficiency, unspecified: Secondary | ICD-10-CM

## 2013-04-28 DIAGNOSIS — J209 Acute bronchitis, unspecified: Secondary | ICD-10-CM

## 2013-04-28 MED ORDER — AZITHROMYCIN 250 MG PO TABS: ORAL_TABLET | ORAL | Status: DC

## 2013-04-28 NOTE — Progress Notes (Signed)
Subjective:    Patient ID: John Leon, male    DOB: March 22, 1951, 62 y.o.   MRN: [id number]  HPI The patient returns to clinic today for followup and management of chronic medical problems. He saw the cardiologist in August of this year and everything was stable at that time. He has a history of a stent in the LAD and May of 2010. He does complain today of cough congestion and fever. Lab work done today. He'll be given an FOBT today and will receive a chest x-ray today.       Patient Active Problem List   Diagnosis Date Noted  . TOBACCO ABUSE 10/08/2008  . CAD 10/08/2008  . CARDIOMYOPATHY 10/08/2008  . MURAL THROMBUS 10/08/2008  . HYPERCHOLESTEROLEMIA-PURE 09/22/2008  . CARDIOVASCULAR FUNCTION STUDY, ABNORMAL 09/22/2008   Outpatient Encounter Prescriptions as of 04/28/2013  Medication Sig  . aspirin 81 MG tablet Take 81 mg by mouth daily.    . carvedilol (COREG) 3.125 MG tablet TAKE 1 TABLET BY MOUTH TWICE A DAY  . Cholecalciferol (VITAMIN D3) 2000 UNITS TABS Take 1 tablet by mouth daily.    . clopidogrel (PLAVIX) 75 MG tablet TAKE 1 TABLET (75 MG TOTAL) BY MOUTH DAILY.  Marland Kitchen lisinopril (PRINIVIL,ZESTRIL) 2.5 MG tablet TAKE 1 TABLET DAILY WITH 5 MG TABLET FOR A TOTAL OF 7.5 MG DAILY  . lisinopril (PRINIVIL,ZESTRIL) 5 MG tablet TAKE ONE TABLET BY MOUTH DAILY ALONG WITH 2.5 MG.  . nitroGLYCERIN (NITROSTAT) 0.4 MG SL tablet Place 0.4 mg under the tongue every 5 (five) minutes as needed.    . pravastatin (PRAVACHOL) 40 MG tablet TAKE 1 TABLET (40 MG TOTAL) BY MOUTH DAILY.  . [DISCONTINUED] lisinopril (PRINIVIL,ZESTRIL) 2.5 MG tablet TAKE 1 TABLET DAILY WITH 5 MG TABLET FOR A TOTAL OF 7.5 MG DAILY    Review of Systems  Constitutional: Positive for fever.  HENT: Positive for congestion.   Eyes: Negative.   Respiratory: Positive for cough.   Cardiovascular: Negative.   Gastrointestinal: Negative.   Endocrine: Negative.   Genitourinary: Negative.   Musculoskeletal: Negative.     Skin: Negative.   Allergic/Immunologic: Negative.   Neurological: Negative.   Hematological: Negative.   Psychiatric/Behavioral: Negative.        Objective:   Physical Exam  Nursing note and vitals reviewed. Constitutional: He is oriented to person, place, and time. He appears well-developed and well-nourished. No distress.  HENT:  Head: Normocephalic and atraumatic.  Right Ear: External ear normal.  Left Ear: External ear normal.  Mouth/Throat: Oropharynx is clear and moist. No oropharyngeal exudate.  Slight nasal congestion left greater than right  Eyes: Conjunctivae and EOM are normal. Pupils are equal, round, and reactive to light. Right eye exhibits no discharge. Left eye exhibits no discharge. No scleral icterus.  Neck: Normal range of motion. Neck supple. No thyromegaly present.  No carotid bruits  Cardiovascular: Normal rate, regular rhythm, normal heart sounds and intact distal pulses.  Exam reveals no gallop and no friction rub.   No murmur heard. At 60 per minute  Pulmonary/Chest: Effort normal. No respiratory distress. He has wheezes. He has no rales ( rare wheezes).  Abdominal: Soft. Bowel sounds are normal. He exhibits no mass. There is no tenderness. There is no rebound and no guarding.  Musculoskeletal: Normal range of motion. He exhibits no edema and no tenderness.  Lymphadenopathy:    He has no cervical adenopathy.  Neurological: He is alert and oriented to person, place, and time. He has normal  reflexes. No cranial nerve deficit.  Skin: Skin is warm and dry. No rash noted. No erythema. No pallor.  Psychiatric: He has a normal mood and affect. His behavior is normal. Judgment and thought content normal.   BP 115/72  Pulse 59  Temp(Src) 99.5 F (37.5 C) (Oral)  Ht 5' 9.5" (1.765 m)  Wt 159 lb (72.122 kg)  BMI 23.15 kg/m2  [facility] reading (PRIMARY) by  Dr. Tracie Harrier -no active disease somewhat flat diaphragms bilaterally                                      Assessment & Plan:  1. CAD - POCT CBC - Hepatic function panel - BMP8+EGFR - DG Chest 2 View; Future  2. HYPERCHOLESTEROLEMIA-PURE - POCT CBC - Hepatic function panel - BMP8+EGFR - NMR, lipoprofile  3. Vitamin D deficiency - POCT CBC - Vit D  25 hydroxy (rtn osteoporosis monitoring)  4. HTN (hypertension) - DG Chest 2 View; Future  5. Acute bronchitis - azithromycin (ZITHROMAX) 250 MG tablet; Take two the first day then one daily for infection until completed  Dispense: 6 tablet; Refill: 0  Patient Instructions  Continue current medications. Continue good therapeutic lifestyle changes which include good diet and exercise. Fall precautions discussed with patient. Schedule your flu vaccine if you haven't had it yet If you are over 68 years old - you may need Prevnar 13 or the adult Pneumonia vaccine. Drink plenty of fluids Take Mucinex maximum strength, blue and white in color, over-the-counter, 1 twice daily with a large glass of water If you have a cool mist humidifier use this in your bedroom at night Take medication as directed Return FOBT Check with your insurance regarding the Prevnar vaccine   stop smoking!!!!!!  Nyra Capes MD

## 2013-04-28 NOTE — Patient Instructions (Addendum)
Continue current medications. Continue good therapeutic lifestyle changes which include good diet and exercise. Fall precautions discussed with patient. Schedule your flu vaccine if you haven't had it yet If you are over 63 years old - you may need Prevnar 13 or the adult Pneumonia vaccine. Drink plenty of fluids Take Mucinex maximum strength, blue and white in color, over-the-counter, 1 twice daily with a large glass of water If you have a cool mist humidifier use this in your bedroom at night Take medication as directed Return FOBT Check with your insurance regarding the Prevnar vaccine

## 2013-04-29 LAB — CBC WITH DIFFERENTIAL
Basophils Absolute: 0 10*3/uL (ref 0.0–0.2)
Hemoglobin: 15.9 g/dL (ref 12.6–17.7)
Immature Grans (Abs): 0 10*3/uL (ref 0.0–0.1)
Immature Granulocytes: 0 %
Lymphs: 21 %
MCH: 31.7 pg (ref 26.6–33.0)
Monocytes: 9 %
Neutrophils Relative %: 69 %
Platelets: 198 10*3/uL (ref 150–379)
RDW: 13.5 % (ref 12.3–15.4)
WBC: 8.3 10*3/uL (ref 3.4–10.8)

## 2013-04-29 LAB — HEPATIC FUNCTION PANEL
ALT: 8 IU/L (ref 0–44)
Alkaline Phosphatase: 75 IU/L (ref 39–117)
Bilirubin, Direct: 0.13 mg/dL (ref 0.00–0.40)
Total Bilirubin: 0.5 mg/dL (ref 0.0–1.2)
Total Protein: 6.4 g/dL (ref 6.0–8.5)

## 2013-04-29 LAB — BMP8+EGFR
Chloride: 99 mmol/L (ref 97–108)
GFR calc Af Amer: 98 mL/min/{1.73_m2} (ref >59)
Glucose: 85 mg/dL (ref 65–99)
Potassium: 4.6 mmol/L (ref 3.5–5.2)
Sodium: 139 mmol/L (ref 134–144)

## 2013-04-29 LAB — NMR, LIPOPROFILE
Cholesterol: 164 mg/dL (ref <200)
HDL Particle Number: 27.9 umol/L — ABNORMAL LOW (ref >=30.5)
LDL Size: 20.4 nm — ABNORMAL LOW (ref >20.5)
Small LDL Particle Number: 834 nmol/L — ABNORMAL HIGH (ref <=527)

## 2013-04-29 LAB — VITAMIN D 25 HYDROXY (VIT D DEFICIENCY, FRACTURES): Vit D, 25-Hydroxy: 54.1 ng/mL (ref 30.0–100.0)

## 2013-05-19 ENCOUNTER — Other Ambulatory Visit: Payer: Self-pay | Admitting: Nurse Practitioner

## 2013-05-19 ENCOUNTER — Other Ambulatory Visit: Payer: Self-pay | Admitting: Family Medicine

## 2013-05-26 ENCOUNTER — Other Ambulatory Visit: Payer: Self-pay | Admitting: Nurse Practitioner

## 2013-07-28 ENCOUNTER — Other Ambulatory Visit: Payer: Self-pay | Admitting: Family Medicine

## 2013-08-29 ENCOUNTER — Other Ambulatory Visit: Payer: Self-pay | Admitting: Family Medicine

## 2013-10-08 ENCOUNTER — Other Ambulatory Visit: Payer: Self-pay | Admitting: *Deleted

## 2013-10-08 MED ORDER — LISINOPRIL 2.5 MG PO TABS: ORAL_TABLET | ORAL | Status: DC

## 2013-10-10 ENCOUNTER — Other Ambulatory Visit: Payer: Self-pay | Admitting: Family Medicine

## 2013-10-13 NOTE — Telephone Encounter (Signed)
Last ov 12/14. NTBS.

## 2013-10-27 ENCOUNTER — Encounter: Payer: Self-pay | Admitting: Family Medicine

## 2013-10-27 ENCOUNTER — Ambulatory Visit (INDEPENDENT_AMBULATORY_CARE_PROVIDER_SITE_OTHER): Payer: BC Managed Care – PPO | Admitting: Family Medicine

## 2013-10-27 VITALS — BP 121/75 | HR 60 | Temp 98.6°F | Ht 69.5 in | Wt 161.0 lb

## 2013-10-27 DIAGNOSIS — Z720 Tobacco use: Secondary | ICD-10-CM

## 2013-10-27 DIAGNOSIS — I10 Essential (primary) hypertension: Secondary | ICD-10-CM

## 2013-10-27 DIAGNOSIS — I2510 Atherosclerotic heart disease of native coronary artery without angina pectoris: Secondary | ICD-10-CM

## 2013-10-27 DIAGNOSIS — Z8719 Personal history of other diseases of the digestive system: Secondary | ICD-10-CM | POA: Insufficient documentation

## 2013-10-27 DIAGNOSIS — E7800 Pure hypercholesterolemia, unspecified: Secondary | ICD-10-CM

## 2013-10-27 DIAGNOSIS — Z0000 Encounter for general adult medical examination without abnormal findings: Secondary | ICD-10-CM

## 2013-10-27 DIAGNOSIS — N400 Enlarged prostate without lower urinary tract symptoms: Secondary | ICD-10-CM

## 2013-10-27 DIAGNOSIS — E559 Vitamin D deficiency, unspecified: Secondary | ICD-10-CM

## 2013-10-27 DIAGNOSIS — Z23 Encounter for immunization: Secondary | ICD-10-CM

## 2013-10-27 LAB — POCT URINALYSIS DIPSTICK
Bilirubin, UA: NEGATIVE
GLUCOSE UA: NEGATIVE
KETONES UA: NEGATIVE
Leukocytes, UA: NEGATIVE
Nitrite, UA: NEGATIVE
Protein, UA: NEGATIVE
RBC UA: NEGATIVE
Spec Grav, UA: 1.015
Urobilinogen, UA: NEGATIVE
pH, UA: 6

## 2013-10-27 LAB — POCT UA - MICROSCOPIC ONLY
Bacteria, U Microscopic: NEGATIVE
CRYSTALS, UR, HPF, POC: NEGATIVE
Casts, Ur, LPF, POC: NEGATIVE
Mucus, UA: NEGATIVE
RBC, URINE, MICROSCOPIC: NEGATIVE
WBC, Ur, HPF, POC: NEGATIVE
Yeast, UA: NEGATIVE

## 2013-10-27 LAB — POCT CBC
GRANULOCYTE PERCENT: 60.2 % (ref 37–80)
HCT, POC: 50.3 % (ref 43.5–53.7)
HEMOGLOBIN: 16.6 g/dL (ref 14.1–18.1)
LYMPH, POC: 2.5 (ref 0.6–3.4)
MCH, POC: 30.5 pg (ref 27–31.2)
MCHC: 32.9 g/dL (ref 31.8–35.4)
MCV: 92.6 fL (ref 80–97)
MPV: 7.2 fL (ref 0–99.8)
PLATELET COUNT, POC: 206 10*3/uL (ref 142–424)
POC Granulocyte: 4.2 (ref 2–6.9)
POC LYMPH PERCENT: 36.8 %L (ref 10–50)
RBC: 5.4 M/uL (ref 4.69–6.13)
RDW, POC: 13.1 %
WBC: 6.9 10*3/uL (ref 4.6–10.2)

## 2013-10-27 NOTE — Progress Notes (Signed)
Subjective:    Patient ID: John Leon, male    DOB: 02/17/1951, 63 y.o.   MRN: [id number]  HPI Patient is here today for annual wellness exam and follow up of chronic medical problems. The patient is doing well. He still smokes. He is Prall that as of 8 days from now he will have 8 years without drinking any alcohol. The tdap was discussed today as well as the Prevnar and Pneumovax vaccines. He will check with his insurance regarding these vaccines. He is up-to-date on his eye exams. He will return his FOBT.          Patient Active Problem List   Diagnosis Date Noted  . TOBACCO ABUSE 10/08/2008  . CAD 10/08/2008  . CARDIOMYOPATHY 10/08/2008  . MURAL THROMBUS 10/08/2008  . HYPERCHOLESTEROLEMIA-PURE 09/22/2008  . CARDIOVASCULAR FUNCTION STUDY, ABNORMAL 09/22/2008   Outpatient Encounter Prescriptions as of 10/27/2013  Medication Sig  . aspirin 81 MG tablet Take 81 mg by mouth daily.    . carvedilol (COREG) 3.125 MG tablet TAKE 1 TABLET BY MOUTH TWICE A DAY  . Cholecalciferol (VITAMIN D3) 2000 UNITS TABS Take 1 tablet by mouth daily.    . clopidogrel (PLAVIX) 75 MG tablet TAKE 1 TABLET (75 MG TOTAL) BY MOUTH DAILY.  Marland Kitchen lisinopril (PRINIVIL,ZESTRIL) 2.5 MG tablet TAKE 1 TABLET DAILY WITH 5 MG TABLET FOR A TOTAL OF 7.5 MG DAILY  . lisinopril (PRINIVIL,ZESTRIL) 5 MG tablet TAKE ONE TABLET BY MOUTH DAILY ALONG WITH 2.5 MG.  . nitroGLYCERIN (NITROSTAT) 0.4 MG SL tablet Place 0.4 mg under the tongue every 5 (five) minutes as needed.    . pravastatin (PRAVACHOL) 40 MG tablet TAKE 1 TABLET (40 MG TOTAL) BY MOUTH DAILY.  . [DISCONTINUED] azithromycin (ZITHROMAX) 250 MG tablet Take two the first day then one daily for infection until completed    Review of Systems  Constitutional: Negative.   HENT: Negative.   Eyes: Negative.   Respiratory: Negative.   Cardiovascular: Negative.   Gastrointestinal: Negative.   Endocrine: Negative.   Genitourinary: Negative.   Musculoskeletal:  Negative.   Skin: Negative.   Allergic/Immunologic: Negative.   Neurological: Negative.   Hematological: Negative.   Psychiatric/Behavioral: Negative.        Objective:   Physical Exam  Nursing note and vitals reviewed. Constitutional: He is oriented to person, place, and time. He appears well-developed and well-nourished. No distress.  HENT:  Head: Normocephalic and atraumatic.  Right Ear: External ear normal.  Left Ear: External ear normal.  Nose: Nose normal.  Mouth/Throat: Oropharynx is clear and moist. No oropharyngeal exudate.  Eyes: Conjunctivae and EOM are normal. Pupils are equal, round, and reactive to light. Right eye exhibits no discharge. Left eye exhibits no discharge. No scleral icterus.  Neck: Normal range of motion. Neck supple. No thyromegaly present.  No carotid bruits  Cardiovascular: Normal rate, regular rhythm, normal heart sounds and intact distal pulses.  Exam reveals no gallop and no friction rub.   No murmur heard. No axillary adenopathy. At 72 per minute  Pulmonary/Chest: Effort normal and breath sounds normal. No respiratory distress. He has no wheezes. He has no rales. He exhibits no tenderness.  Minimal congestion with coughing  Abdominal: Soft. Bowel sounds are normal. He exhibits no mass. There is no tenderness. There is no rebound and no guarding.  Genitourinary: Rectum normal and penis normal.  Minimal prostate enlargement left greater than right. There were no nodules. There were no rectal masses. There were no inguinal  hernias palpated. The external genitalia were normal.  Musculoskeletal: Normal range of motion. He exhibits no edema and no tenderness.  Lymphadenopathy:    He has no cervical adenopathy.  Neurological: He is alert and oriented to person, place, and time. He has normal reflexes.  Skin: Skin is warm and dry. No rash noted. No erythema. No pallor.  Psychiatric: He has a normal mood and affect. His behavior is normal. Judgment and  thought content normal.   BP 121/75  Pulse 60  Temp(Src) 98.6 F (37 C) (Oral)  Ht 5' 9.5" (1.765 m)  Wt 161 lb (73.029 kg)  BMI 23.44 kg/m2        Assessment & Plan:   1. CAD - POCT CBC - BMP8+EGFR - Hepatic function panel -Continue followup with cardiology  2. HYPERCHOLESTEROLEMIA-PURE - POCT CBC - NMR, lipoprofile  3. Annual physical exam - POCT CBC - POCT UA - Microscopic Only - POCT urinalysis dipstick - BMP8+EGFR - Hepatic function panel - PSA, total and free - NMR, lipoprofile - Vit D  25 hydroxy (rtn osteoporosis monitoring)  4. BPH (benign prostatic hyperplasia) - POCT UA - Microscopic Only - POCT urinalysis dipstick - PSA, total and free  5. Vitamin D deficiency - Vit D  25 hydroxy (rtn osteoporosis monitoring)  6. Essential hypertension  7. History of gastroesophageal reflux (GERD)  8. Nicotine abuse -Stop smoking  No orders of the defined types were placed in this encounter.   Patient Instructions  Continue current medications. Continue good therapeutic lifestyle changes which include good diet and exercise. Fall precautions discussed with patient. If an FOBT was given today- please return it to our front desk. If you are over 60 years old - you may need Prevnar 49 or the adult Pneumonia vaccine.  Continue to try to stop smoking Continue followup with the cardiologist Get your eyes checked regularly   Arrie Senate MD

## 2013-10-27 NOTE — Patient Instructions (Addendum)
Continue current medications. Continue good therapeutic lifestyle changes which include good diet and exercise. Fall precautions discussed with patient. If an FOBT was given today- please return it to our front desk. If you are over 64 years old - you may need Prevnar 13 or the adult Pneumonia vaccine.  Continue to try to stop smoking Continue followup with the cardiologist Get your eyes checked regularly

## 2013-10-28 LAB — HEPATIC FUNCTION PANEL
ALT: 13 IU/L (ref 0–44)
AST: 13 IU/L (ref 0–40)
Albumin: 4.8 g/dL (ref 3.6–4.8)
Alkaline Phosphatase: 78 IU/L (ref 39–117)
BILIRUBIN TOTAL: 0.3 mg/dL (ref 0.0–1.2)
Bilirubin, Direct: 0.09 mg/dL (ref 0.00–0.40)
TOTAL PROTEIN: 6.6 g/dL (ref 6.0–8.5)

## 2013-10-28 LAB — BMP8+EGFR
BUN/Creatinine Ratio: 13 (ref 10–22)
BUN: 15 mg/dL (ref 8–27)
CO2: 25 mmol/L (ref 18–29)
Calcium: 9.2 mg/dL (ref 8.6–10.2)
Chloride: 97 mmol/L (ref 97–108)
Creatinine, Ser: 1.14 mg/dL (ref 0.76–1.27)
GFR calc Af Amer: 78 mL/min/{1.73_m2} (ref >59)
GFR, EST NON AFRICAN AMERICAN: 68 mL/min/{1.73_m2} (ref >59)
GLUCOSE: 84 mg/dL (ref 65–99)
Potassium: 4.8 mmol/L (ref 3.5–5.2)
Sodium: 139 mmol/L (ref 134–144)

## 2013-10-28 LAB — VITAMIN D 25 HYDROXY (VIT D DEFICIENCY, FRACTURES): VIT D 25 HYDROXY: 47.5 ng/mL (ref 30.0–100.0)

## 2013-10-28 LAB — NMR, LIPOPROFILE
Cholesterol: 188 mg/dL (ref 100–199)
HDL CHOLESTEROL BY NMR: 45 mg/dL (ref >39)
HDL PARTICLE NUMBER: 35.8 umol/L (ref >=30.5)
LDL Particle Number: 1899 nmol/L — ABNORMAL HIGH (ref <1000)
LDL Size: 20.6 nm (ref >20.5)
LDLC SERPL CALC-MCNC: 124 mg/dL — AB (ref 0–99)
LP-IR Score: 51 — ABNORMAL HIGH (ref <=45)
Small LDL Particle Number: 1151 nmol/L — ABNORMAL HIGH (ref <=527)
TRIGLYCERIDES BY NMR: 96 mg/dL (ref 0–149)

## 2013-10-28 LAB — PSA, TOTAL AND FREE
PSA FREE: 0.23 ng/mL
PSA, Free Pct: 20.9 %
PSA: 1.1 ng/mL (ref 0.0–4.0)

## 2013-11-06 ENCOUNTER — Other Ambulatory Visit: Payer: Self-pay | Admitting: Family Medicine

## 2013-11-13 ENCOUNTER — Other Ambulatory Visit: Payer: Self-pay | Admitting: *Deleted

## 2013-11-13 DIAGNOSIS — E785 Hyperlipidemia, unspecified: Secondary | ICD-10-CM

## 2013-11-13 MED ORDER — ROSUVASTATIN CALCIUM 10 MG PO TABS: 10.0000 mg | ORAL_TABLET | Freq: Every day | ORAL | Status: DC

## 2013-12-16 ENCOUNTER — Other Ambulatory Visit: Payer: Self-pay | Admitting: *Deleted

## 2013-12-16 MED ORDER — LISINOPRIL 2.5 MG PO TABS: ORAL_TABLET | ORAL | Status: DC

## 2013-12-23 ENCOUNTER — Other Ambulatory Visit: Payer: Self-pay | Admitting: Family Medicine

## 2013-12-26 NOTE — Telephone Encounter (Signed)
Last seen and last lipid 10/27/13  This med not on EPIC list

## 2014-01-01 ENCOUNTER — Other Ambulatory Visit: Payer: Self-pay | Admitting: Family Medicine

## 2014-01-19 ENCOUNTER — Other Ambulatory Visit: Payer: Self-pay | Admitting: Family Medicine

## 2014-01-22 ENCOUNTER — Other Ambulatory Visit: Payer: Self-pay | Admitting: Cardiology

## 2014-01-27 ENCOUNTER — Other Ambulatory Visit: Payer: Self-pay | Admitting: Family Medicine

## 2014-02-27 ENCOUNTER — Other Ambulatory Visit: Payer: Self-pay | Admitting: Cardiology

## 2014-03-31 ENCOUNTER — Other Ambulatory Visit: Payer: Self-pay | Admitting: Family Medicine

## 2014-04-28 ENCOUNTER — Other Ambulatory Visit: Payer: Self-pay | Admitting: Cardiology

## 2014-04-28 ENCOUNTER — Other Ambulatory Visit: Payer: Self-pay | Admitting: Family Medicine

## 2014-04-29 ENCOUNTER — Other Ambulatory Visit: Payer: Self-pay | Admitting: Nurse Practitioner

## 2014-05-04 ENCOUNTER — Ambulatory Visit: Payer: BC Managed Care – PPO | Admitting: Family Medicine

## 2014-05-29 ENCOUNTER — Other Ambulatory Visit: Payer: Self-pay | Admitting: Family Medicine

## 2014-05-29 ENCOUNTER — Other Ambulatory Visit: Payer: Self-pay | Admitting: Cardiology

## 2014-05-29 NOTE — Telephone Encounter (Signed)
Should this be sent to pcp? Patient is overdue for an appointment and the primary has been refilling the 5mg  tablet. Please advise. Thanks, MI

## 2014-05-29 NOTE — Telephone Encounter (Signed)
Yes have the pcp fill it

## 2014-06-05 ENCOUNTER — Other Ambulatory Visit: Payer: Self-pay | Admitting: Family Medicine

## 2014-06-27 ENCOUNTER — Other Ambulatory Visit: Payer: Self-pay | Admitting: Family Medicine

## 2014-06-27 ENCOUNTER — Other Ambulatory Visit: Payer: Self-pay | Admitting: Cardiology

## 2014-07-02 ENCOUNTER — Other Ambulatory Visit: Payer: Self-pay | Admitting: Family Medicine

## 2014-07-21 ENCOUNTER — Other Ambulatory Visit: Payer: Self-pay | Admitting: Family Medicine

## 2014-07-21 ENCOUNTER — Other Ambulatory Visit: Payer: Self-pay | Admitting: Cardiology

## 2014-07-21 NOTE — Telephone Encounter (Signed)
Called patient  Left message to return call. Patient has not seen Dr Antoine PocheHochrein since 2014- appointment is needed for continue refills

## 2014-07-22 NOTE — Telephone Encounter (Signed)
Spoke to patient  He [place] make an appointment [place] Transferred to sched[place] Refill x 3

## 2014-07-30 ENCOUNTER — Other Ambulatory Visit: Payer: Self-pay | Admitting: Family Medicine

## 2014-08-04 ENCOUNTER — Other Ambulatory Visit: Payer: Self-pay | Admitting: Cardiology

## 2014-08-25 ENCOUNTER — Other Ambulatory Visit: Payer: Self-pay | Admitting: Family Medicine

## 2014-08-26 ENCOUNTER — Ambulatory Visit (INDEPENDENT_AMBULATORY_CARE_PROVIDER_SITE_OTHER): Payer: Medicare Other | Admitting: Cardiology

## 2014-08-26 ENCOUNTER — Encounter: Payer: Self-pay | Admitting: Cardiology

## 2014-08-26 VITALS — BP 107/68 | HR 62 | Ht 69.5 in | Wt 161.0 lb

## 2014-08-26 DIAGNOSIS — I10 Essential (primary) hypertension: Secondary | ICD-10-CM

## 2014-08-26 NOTE — Progress Notes (Signed)
HPI The patient presents for follow up of CAD.  Since I last saw him he has done well. The patient denies any new symptoms such as chest discomfort, neck or arm discomfort. There has been no new shortness of breath, PND or orthopnea. There have been no reported palpitations, presyncope or syncope.  He does still smoke.  He has been alcohol free for 107 months.  He remains active although he doesn't exercise routinely.  He will go hiking with his boys.    Allergies  Allergen Reactions  . Penicillins     REACTION: Reaction not known    Current Outpatient Prescriptions  Medication Sig Dispense Refill  . aspirin 81 MG tablet Take 81 mg by mouth daily.      . carvedilol (COREG) 3.125 MG tablet TAKE 1 TABLET BY MOUTH TWICE A DAY 60 tablet 1  . Cholecalciferol (VITAMIN D3) 2000 UNITS TABS Take 1 tablet by mouth daily.      . clopidogrel (PLAVIX) 75 MG tablet TAKE 1 TABLET EVERY DAY 30 tablet 0  . lisinopril (PRINIVIL,ZESTRIL) 2.5 MG tablet TAKE 1 TABLET DAILY WITH 5 MG TABLET FOR A TOTAL OF 7.5 MG DAILY 30 tablet 3  . lisinopril (PRINIVIL,ZESTRIL) 5 MG tablet TAKE ONE TABLET BY MOUTH DAILY ALONG WITH 2.5 MG. 30 tablet 0  . nitroGLYCERIN (NITROSTAT) 0.4 MG SL tablet Place 0.4 mg under the tongue every 5 (five) minutes as needed.      . pravastatin (PRAVACHOL) 40 MG tablet TAKE 1 TABLET (40 MG TOTAL) BY MOUTH DAILY. (Patient not taking: Reported on 08/26/2014) 30 tablet 3  . rosuvastatin (CRESTOR) 10 MG tablet Take 1 tablet (10 mg total) by mouth daily. (Patient not taking: Reported on 08/26/2014) 30 tablet 1   No current facility-administered medications for this visit.    Past Medical History  Diagnosis Date  . Hyperlipidemia   . H/O: alcohol abuse   . Coronary artery disease     s/p DES to LAD, occluded RCA  . Ischemic cardiomyopathy   . Post-infarction apical thrombus     Past Surgical History  Procedure Laterality Date  . None      ROS:  As stated in the HPI and negative for all  other systems.  PHYSICAL EXAM BP 107/68 mmHg  Pulse 62  Ht 5' 9.5" (1.765 m)  Wt 161 lb (73.029 kg)  BMI 23.44 kg/m2 GENERAL:  Well appearing HEENT:  Pupils equal round and reactive, fundi not visualized, oral mucosa unremarkable NECK:  No jugular venous distention, waveform within normal limits, carotid upstroke brisk and symmetric, no bruits, no thyromegaly LUNGS:  Clear to auscultation bilaterally BACK:  No CVA tenderness CHEST:  Unremarkable HEART:  PMI not displaced or sustained,S1 and S2 within normal limits, no S3, no S4, no clicks, no rubs, no murmurs ABD:  Flat, positive bowel sounds normal in frequency in pitch, no bruits, no rebound, no guarding, no midline pulsatile mass, no hepatomegaly, no splenomegaly EXT:  2 plus pulses throughout, no edema, no cyanosis no clubbing   EKG:  Sinus bradycardia, rate 62, old inferior infarct, lateral T-wave inversions unchanged from previous.   08/26/2014  ASSESSMENT AND PLAN  CAD:  The patient has no new symptoms.  Because of his ongoing tobacco abuse I will continue his Plavix and other meds as listed. He is at higher risk for future cardiovascular events with this ongoing risk factor and he and I have discussed this at length.  TOBACCO ABUSE:  We discussed the  need to stop smoking but he says he will be unable to do this. He does not want further medical therapy.  ISCHEMIC CARDIOMYOPATHY:  His EF has been very mildly reduced in the past. However, he has no symptoms. He wouldn't tolerate further med titration with low blood pressure. No change in therapy is indicated.  DYSLIPIDEMIA:  This is followed and managed by Dr. Christell ConstantMoore.

## 2014-08-26 NOTE — Patient Instructions (Signed)
Medication Instructions:  Your physician recommends that you continue on your current medications as directed. Please refer to the Current Medication list given to you today.  Labwork: None  Testing/Procedures: None  Follow-Up: Follow [place] in 18 months [place]  You will receive a letter in the mail 2 months before you are due.  Please call us when you receive this letter to schedule your follow up appointment.  Thank you for choosing [facility]!!

## 2014-08-31 ENCOUNTER — Other Ambulatory Visit: Payer: Self-pay | Admitting: Family Medicine

## 2014-08-31 NOTE — Telephone Encounter (Signed)
[facility], no showed for appt 05/29/14, [facility]

## 2014-09-10 ENCOUNTER — Other Ambulatory Visit: Payer: Self-pay | Admitting: Cardiology

## 2014-09-14 ENCOUNTER — Other Ambulatory Visit: Payer: Self-pay | Admitting: Cardiology

## 2014-10-26 ENCOUNTER — Other Ambulatory Visit: Payer: Self-pay

## 2014-12-03 ENCOUNTER — Other Ambulatory Visit: Payer: Self-pay | Admitting: Family Medicine

## 2014-12-11 ENCOUNTER — Ambulatory Visit (INDEPENDENT_AMBULATORY_CARE_PROVIDER_SITE_OTHER): Payer: Medicare Other

## 2014-12-11 ENCOUNTER — Ambulatory Visit (INDEPENDENT_AMBULATORY_CARE_PROVIDER_SITE_OTHER): Payer: Medicare Other | Admitting: Family Medicine

## 2014-12-11 ENCOUNTER — Encounter: Payer: Self-pay | Admitting: Family Medicine

## 2014-12-11 VITALS — BP 128/77 | HR 57 | Temp 97.9°F | Ht 69.5 in | Wt 160.0 lb

## 2014-12-11 DIAGNOSIS — R05 Cough: Secondary | ICD-10-CM | POA: Diagnosis not present

## 2014-12-11 DIAGNOSIS — I10 Essential (primary) hypertension: Secondary | ICD-10-CM | POA: Diagnosis not present

## 2014-12-11 DIAGNOSIS — E7800 Pure hypercholesterolemia, unspecified: Secondary | ICD-10-CM

## 2014-12-11 DIAGNOSIS — R5383 Other fatigue: Secondary | ICD-10-CM | POA: Diagnosis not present

## 2014-12-11 DIAGNOSIS — I2510 Atherosclerotic heart disease of native coronary artery without angina pectoris: Secondary | ICD-10-CM

## 2014-12-11 DIAGNOSIS — F1910 Other psychoactive substance abuse, uncomplicated: Secondary | ICD-10-CM | POA: Diagnosis not present

## 2014-12-11 DIAGNOSIS — Z23 Encounter for immunization: Secondary | ICD-10-CM

## 2014-12-11 DIAGNOSIS — Z720 Tobacco use: Secondary | ICD-10-CM

## 2014-12-11 DIAGNOSIS — E559 Vitamin D deficiency, unspecified: Secondary | ICD-10-CM

## 2014-12-11 DIAGNOSIS — N400 Enlarged prostate without lower urinary tract symptoms: Secondary | ICD-10-CM | POA: Diagnosis not present

## 2014-12-11 DIAGNOSIS — R059 Cough, unspecified: Secondary | ICD-10-CM

## 2014-12-11 MED ORDER — CARVEDILOL 3.125 MG PO TABS: 3.1250 mg | ORAL_TABLET | Freq: Two times a day (BID) | ORAL | Status: DC

## 2014-12-11 NOTE — Patient Instructions (Addendum)
Continue current medications. Continue good therapeutic lifestyle changes which include good diet and exercise. Fall precautions discussed with patient. If an FOBT was given today- please return it to our front desk. If you are over 65 years old - you may need Prevnar 13 or the adult Pneumonia vaccine. Flu shots are recommended around October or November.   After your visit with [place] today you will receive a survey in the mail or online from [employer] regarding your care with [place]. Please take a moment to fill this out. Your feedback is very important to [place] as you can help [place] better understand your patient needs as well as improve your experience and satisfaction. WE CARE ABOUT YOU!!!                         Medicare Annual Wellness Visit  [place] and the medical providers at [facility] strive to bring you the best medical care.  In doing so we not only want to address your current medical conditions and concerns but also to detect new conditions early and prevent illness, disease and health-related problems.    Medicare offers a yearly Wellness Visit which allows our clinical staff to assess your need for preventative services including immunizations, lifestyle education, counseling to decrease risk of preventable diseases and screening for fall risk and other medical concerns.    This visit is provided free of charge (no copay) for all Medicare recipients. The clinical pharmacists at [facility] have begun to conduct these Wellness Visits which will also include a thorough review of all your medications.    As you primary medical provider recommend that you make an appointment for your Annual Wellness Visit if you have not done so already this year.  You may set up this appointment before you leave today or you may call back ([phone]) and schedule an appointment.  Please make sure when you call that you mention that you are scheduling your Annual  Wellness Visit with the clinical pharmacist so that the appointment may be made for the proper length of time.    Pneumococcal Vaccine, Polyvalent suspension for injection What is this medicine? PNEUMOCOCCAL VACCINE, POLYVALENT (NEU mo KOK al vak SEEN, pol ee VEY luhnt) is a vaccine to prevent pneumococcus bacteria infection. These bacteria are a major cause of ear infections, 'Strep throat' infections, and serious pneumonia, meningitis, or blood infections worldwide. These vaccines help the body to produce antibodies (protective substances) that help your body defend against these bacteria. This vaccine is recommended for infants and young children. This vaccine will not treat an infection. This medicine may be used for other purposes; ask your health care provider or pharmacist if you have questions. COMMON BRAND NAME(S): Prevnar 13 What should I tell my health care provider before I take this medicine? They need to know if you have any of these conditions: -bleeding problems -fever -immune system problems -low platelet count in the blood -seizures -an unusual or allergic reaction to pneumococcal vaccine, diphtheria toxoid, other vaccines, latex, other medicines, foods, dyes, or preservatives -pregnant or trying to get pregnant -breast-feeding How should I use this medicine? This vaccine is for injection into a muscle. It is given by a health care professional. A copy of Vaccine Information Statements will be given before each vaccination. Read this sheet carefully each time. The sheet may change frequently. Talk to your pediatrician regarding the use of this medicine in children. While  this drug may be prescribed for children as young as 18 weeks old for selected conditions, precautions do apply. Overdosage: If you think you have taken too much of this medicine contact a poison control center or emergency room at once. NOTE: This medicine is only for you. Do not share this medicine with  others. What if I miss a dose? It is important not to miss your dose. Call your doctor or health care professional if you are unable to keep an appointment. What may interact with this medicine? -medicines for cancer chemotherapy -medicines that suppress your immune function -medicines that treat or prevent blood clots like warfarin, enoxaparin, and dalteparin -steroid medicines like prednisone or cortisone This list may not describe all possible interactions. Give your health care provider a list of all the medicines, herbs, non-prescription drugs, or dietary supplements you use. Also tell them if you smoke, drink alcohol, or use illegal drugs. Some items may interact with your medicine. What should I watch for while using this medicine? Mild fever and pain should go away in 3 days or less. Report any unusual symptoms to your doctor or health care professional. What side effects may I notice from receiving this medicine? Side effects that you should report to your doctor or health care professional as soon as possible: -allergic reactions like skin rash, itching or hives, swelling of the face, lips, or tongue -breathing problems -confused -fever over 102 degrees F -pain, tingling, numbness in the hands or feet -seizures -unusual bleeding or bruising -unusual muscle weakness Side effects that usually do not require medical attention (report to your doctor or health care professional if they continue or are bothersome): -aches and pains -diarrhea -fever of 102 degrees F or less -headache -irritable -loss of appetite -pain, tender at site where injected -trouble sleeping This list may not describe all possible side effects. Call your doctor for medical advice about side effects. You may report side effects to FDA at 1-800-FDA-1088. Where should I keep my medicine? This does not apply. This vaccine is given in a clinic, pharmacy, doctor's office, or other health care setting and will not  be stored at home. NOTE: This sheet is a summary. It may not cover all possible information. If you have questions about this medicine, talk to your doctor, pharmacist, or health care provider.  2015, Elsevier/Gold Standard. (2008-06-30 10:17:22)   Take Mucinex, maximum strength, blue and white in color, 1 twice daily with a large glass of water Use nasal saline frequently through the day Drink plenty of fluids Call if start of fever or purulent sputum and we will call in an antibiotic Follow-up with cardiology as planned The patient should make all efforts to stop smoking completely He will be due to get his Pneumovax in 1 year He should get his eye exam and do this at least every 2 years

## 2014-12-11 NOTE — Progress Notes (Signed)
Subjective:    Patient ID: John Leon, male    DOB: [birth year], 64 y.o.   MRN: [id number]  HPI  64 year old male comes in today to follow up on chronic medical conditions which include hypertension, hyperlipidemia, and CAD. Prescription list was reviewed with the patient. The patient is doing well today with no specific complaints. He does have a slight head congestion and cough problem but he feels this is getting better. He denies chest pain, shortness of breath trouble swallowing and heartburn indigestion nausea vomiting diarrhea or blood in the stool. He is also having no problems with passing his water. He is due to have an eye exam and this is scheduled. He saw the cardiologist for follow-up 2 months ago and everything was good and a repeat visit is not scheduled until 18 months from that time. He is still smoking and he says he knows he needs to stop all we can do is encourage him to do that. His family history was reviewed and that his mother died of dementia, his father died from an aneurysm and his brother died from complications of agent orange. He will get lab work today and he is already received his Prevnar vaccine and understands that he will get his Pneumovax in a year.   Patient Active Problem List   Diagnosis Date Noted  . Hypertension 10/27/2013  . History of gastroesophageal reflux (GERD) 10/27/2013  . Nicotine abuse 10/27/2013  . TOBACCO ABUSE 10/08/2008  . Coronary atherosclerosis 10/08/2008  . CARDIOMYOPATHY 10/08/2008  . MURAL THROMBUS 10/08/2008  . HYPERCHOLESTEROLEMIA-PURE 09/22/2008  . CARDIOVASCULAR FUNCTION STUDY, ABNORMAL 09/22/2008   Outpatient Encounter Prescriptions as of 12/11/2014  Medication Sig  . aspirin 81 MG tablet Take 81 mg by mouth daily.    . carvedilol (COREG) 3.125 MG tablet Take 1 tablet (3.125 mg total) by mouth 2 (two) times daily.  . Cholecalciferol (VITAMIN D3) 2000 UNITS TABS Take 1 tablet by mouth daily.    . clopidogrel (PLAVIX)  75 MG tablet TAKE 1 TABLET EVERY DAY  . lisinopril (PRINIVIL,ZESTRIL) 2.5 MG tablet TAKE 1 TABLET DAILY WITH 5 MG TABLET FOR A TOTAL OF 7.5 MG DAILY  . lisinopril (PRINIVIL,ZESTRIL) 5 MG tablet TAKE ONE TABLET BY MOUTH DAILY ALONG WITH 2.5 MG.  . nitroGLYCERIN (NITROSTAT) 0.4 MG SL tablet Place 0.4 mg under the tongue every 5 (five) minutes as needed.    . [DISCONTINUED] carvedilol (COREG) 3.125 MG tablet TAKE 1 TABLET BY MOUTH TWICE A DAY  . [DISCONTINUED] pravastatin (PRAVACHOL) 40 MG tablet TAKE 1 TABLET (40 MG TOTAL) BY MOUTH DAILY. (Patient not taking: Reported on 08/26/2014)   No facility-administered encounter medications on file as of 12/11/2014.      Review of Systems  Constitutional: Negative.   HENT: Negative.   Eyes: Negative.   Respiratory: Negative.   Cardiovascular: Negative.   Gastrointestinal: Negative.   Endocrine: Negative.   Genitourinary: Negative.   Musculoskeletal: Negative.   Skin: Negative.   Allergic/Immunologic: Negative.   Neurological: Negative.   Hematological: Negative.   Psychiatric/Behavioral: Negative.        Objective:   Physical Exam  Constitutional: He is oriented to person, place, and time. He appears well-developed and well-nourished. No distress.  HENT:  Head: Normocephalic and atraumatic.  Right Ear: External ear normal.  Left Ear: External ear normal.  Nose: Nose normal.  Mouth/Throat: Oropharynx is clear and moist. No oropharyngeal exudate.  Eyes: Conjunctivae and EOM are normal. Pupils are equal, round,  and reactive to light. Right eye exhibits no discharge. Left eye exhibits no discharge. No scleral icterus.  Neck: Normal range of motion. Neck supple. No thyromegaly present.  Cardiovascular: Normal rate, regular rhythm, normal heart sounds and intact distal pulses.  Exam reveals no gallop and no friction rub.   No murmur heard. At 72/m  Pulmonary/Chest: Effort normal and breath sounds normal. No respiratory distress. He has no  wheezes. He has no rales. He exhibits no tenderness.  Clear anteriorly and posteriorly with slightly decreased breath sounds. Patient has a tight cough most likely from chronic bronchitis and nicotine abuse  Abdominal: Soft. Bowel sounds are normal. He exhibits no mass. There is no tenderness. There is no rebound and no guarding.  Nontender without masses or organ enlargement or inguinal adenopathy  Genitourinary: Rectum normal and penis normal. No penile tenderness.  The prostate is enlarged but soft and smooth without lumps or nodules and there are no rectal masses. The external genitalia were normal. There are no inguinal hernias and no inguinal nodes.  Musculoskeletal: Normal range of motion. He exhibits no edema or tenderness.  Lymphadenopathy:    He has no cervical adenopathy.  Neurological: He is alert and oriented to person, place, and time. He has normal reflexes. No cranial nerve deficit.  Skin: Skin is warm and dry. No rash noted. No erythema. No pallor.  Psychiatric: He has a normal mood and affect. His behavior is normal. Judgment and thought content normal.  Nursing note and vitals reviewed.  BP 128/77 mmHg  Pulse 57  Temp(Src) 97.9 F (36.6 C) (Oral)  Ht 5' 9.5" (1.765 m)  Wt 160 lb (72.576 kg)  BMI 23.30 kg/m2  [facility] reading (PRIMARY) by  Dr.Klaudia Beirne--chest x-ray-   no active disease                                     Assessment & Plan:  1. Essential hypertension -The blood pressure is good today he should continue with current treatment - BMP8+EGFR - CBC with Differential/Platelet  2. HYPERCHOLESTEROLEMIA-PURE -He should continue with his current treatment of pravastatin and continue with aggressive therapeutic lifestyle changes which include diet and exercise - Hepatic function panel - NMR, lipoprofile - CBC with Differential/Platelet  3. BPH (benign prostatic hypertrophy) -He is not having any symptoms with this and he will continue to drink plenty of  fluids. - PSA, total and free - CBC with Differential/Platelet  4. Vitamin D deficiency -Continue current treatment pending results of lab work - Vit D  25 hydroxy (rtn osteoporosis monitoring) - CBC with Differential/Platelet  5. Other fatigue -We will check blood work today and hopefully there will be no calls for the fatigue. - Thyroid Panel With TSH - CBC with Differential/Platelet  6. Cough -Take Mucinex, over-the-counter, 1 twice daily with a large glass of water and use nasal saline - DG Chest 2 View; Future  7. Nicotine abuse -Make all efforts at stopping cigarette smoking and feel free to call us if we can help with this - DG Chest 2 View; Future  8. ASCVD (arteriosclerotic cardiovascular disease) -Follow-up regularly with cardiology  Meds ordered this encounter  Medications  . carvedilol (COREG) 3.125 MG tablet    Sig: Take 1 tablet (3.125 mg total) by mouth 2 (two) times daily.    Dispense:  180 tablet    Refill:  1   Patient Instructions  Continue current  medications. Continue good therapeutic lifestyle changes which include good diet and exercise. Fall precautions discussed with patient. If an FOBT was given today- please return it to our front desk. If you are over 59 years old - you may need Prevnar 73 or the adult Pneumonia vaccine. Flu shots are recommended around October or November.   After your visit with [place] today you will receive a survey in the mail or online from [employer] regarding your care with [place]. Please take a moment to fill this out. Your feedback is very important to [place] as you can help [place] better understand your patient needs as well as improve your experience and satisfaction. WE CARE ABOUT YOU!!!                         Medicare Annual Wellness Visit  [place] and the medical providers at [facility] strive to bring you the best medical care.  In doing so we not only want to address your current medical  conditions and concerns but also to detect new conditions early and prevent illness, disease and health-related problems.    Medicare offers a yearly Wellness Visit which allows our clinical staff to assess your need for preventative services including immunizations, lifestyle education, counseling to decrease risk of preventable diseases and screening for fall risk and other medical concerns.    This visit is provided free of charge (no copay) for all Medicare recipients. The clinical pharmacists at [facility] have begun to conduct these Wellness Visits which will also include a thorough review of all your medications.    As you primary medical provider recommend that you make an appointment for your Annual Wellness Visit if you have not done so already this year.  You may set up this appointment before you leave today or you may call back ([phone]) and schedule an appointment.  Please make sure when you call that you mention that you are scheduling your Annual Wellness Visit with the clinical pharmacist so that the appointment may be made for the proper length of time.    Pneumococcal Vaccine, Polyvalent suspension for injection What is this medicine? PNEUMOCOCCAL VACCINE, POLYVALENT (NEU mo KOK al vak SEEN, pol ee VEY luhnt) is a vaccine to prevent pneumococcus bacteria infection. These bacteria are a major cause of ear infections, 'Strep throat' infections, and serious pneumonia, meningitis, or blood infections worldwide. These vaccines help the body to produce antibodies (protective substances) that help your body defend against these bacteria. This vaccine is recommended for infants and young children. This vaccine will not treat an infection. This medicine may be used for other purposes; ask your health care provider or pharmacist if you have questions. COMMON BRAND NAME(S): Prevnar 13 What should I tell my health care provider before I take this medicine? They need to  know if you have any of these conditions: -bleeding problems -fever -immune system problems -low platelet count in the blood -seizures -an unusual or allergic reaction to pneumococcal vaccine, diphtheria toxoid, other vaccines, latex, other medicines, foods, dyes, or preservatives -pregnant or trying to get pregnant -breast-feeding How should I use this medicine? This vaccine is for injection into a muscle. It is given by a health care professional. A copy of Vaccine Information Statements will be given before each vaccination. Read this sheet carefully each time. The sheet may change frequently. Talk to your pediatrician regarding the use of this medicine in children. While this  drug may be prescribed for children as young as 88 weeks old for selected conditions, precautions do apply. Overdosage: If you think you have taken too much of this medicine contact a poison control center or emergency room at once. NOTE: This medicine is only for you. Do not share this medicine with others. What if I miss a dose? It is important not to miss your dose. Call your doctor or health care professional if you are unable to keep an appointment. What may interact with this medicine? -medicines for cancer chemotherapy -medicines that suppress your immune function -medicines that treat or prevent blood clots like warfarin, enoxaparin, and dalteparin -steroid medicines like prednisone or cortisone This list may not describe all possible interactions. Give your health care provider a list of all the medicines, herbs, non-prescription drugs, or dietary supplements you use. Also tell them if you smoke, drink alcohol, or use illegal drugs. Some items may interact with your medicine. What should I watch for while using this medicine? Mild fever and pain should go away in 3 days or less. Report any unusual symptoms to your doctor or health care professional. What side effects may I notice from receiving this  medicine? Side effects that you should report to your doctor or health care professional as soon as possible: -allergic reactions like skin rash, itching or hives, swelling of the face, lips, or tongue -breathing problems -confused -fever over 102 degrees F -pain, tingling, numbness in the hands or feet -seizures -unusual bleeding or bruising -unusual muscle weakness Side effects that usually do not require medical attention (report to your doctor or health care professional if they continue or are bothersome): -aches and pains -diarrhea -fever of 102 degrees F or less -headache -irritable -loss of appetite -pain, tender at site where injected -trouble sleeping This list may not describe all possible side effects. Call your doctor for medical advice about side effects. You may report side effects to FDA at 1-800-FDA-1088. Where should I keep my medicine? This does not apply. This vaccine is given in a clinic, pharmacy, doctor's office, or other health care setting and will not be stored at home. NOTE: This sheet is a summary. It may not cover all possible information. If you have questions about this medicine, talk to your doctor, pharmacist, or health care provider.  2015, Elsevier/Gold Standard. (2008-06-30 10:17:22)   Take Mucinex, maximum strength, blue and white in color, 1 twice daily with a large glass of water Use nasal saline frequently through the day Drink plenty of fluids Call if start of fever or purulent sputum and we will call in an antibiotic Follow-up with cardiology as planned The patient should make all efforts to stop smoking completely He will be due to get his Pneumovax in 1 year He should get his eye exam and do this at least every 2 years   Arrie Senate MD

## 2014-12-12 LAB — NMR, LIPOPROFILE
Cholesterol: 204 mg/dL — ABNORMAL HIGH (ref 100–199)
HDL Cholesterol by NMR: 37 mg/dL — ABNORMAL LOW (ref >39)
HDL PARTICLE NUMBER: 28.5 umol/L — AB (ref >=30.5)
LDL PARTICLE NUMBER: 1877 nmol/L — AB (ref <1000)
LDL Size: 20.5 nm (ref >20.5)
LDL-C: 143 mg/dL — ABNORMAL HIGH (ref 0–99)
LP-IR Score: 54 — ABNORMAL HIGH (ref <=45)
SMALL LDL PARTICLE NUMBER: 1052 nmol/L — AB (ref <=527)
Triglycerides by NMR: 120 mg/dL (ref 0–149)

## 2014-12-12 LAB — CBC WITH DIFFERENTIAL/PLATELET
Basophils Absolute: 0 10*3/uL (ref 0.0–0.2)
Basos: 1 %
EOS (ABSOLUTE): 0.1 10*3/uL (ref 0.0–0.4)
EOS: 2 %
Hematocrit: 47.2 % (ref 37.5–51.0)
Hemoglobin: 15.8 g/dL (ref 12.6–17.7)
IMMATURE GRANS (ABS): 0 10*3/uL (ref 0.0–0.1)
IMMATURE GRANULOCYTES: 0 %
LYMPHS ABS: 2.2 10*3/uL (ref 0.7–3.1)
LYMPHS: 35 %
MCH: 30.9 pg (ref 26.6–33.0)
MCHC: 33.5 g/dL (ref 31.5–35.7)
MCV: 92 fL (ref 79–97)
MONOCYTES: 8 %
MONOS ABS: 0.5 10*3/uL (ref 0.1–0.9)
NEUTROS ABS: 3.4 10*3/uL (ref 1.4–7.0)
NEUTROS PCT: 54 %
PLATELETS: 211 10*3/uL (ref 150–379)
RBC: 5.12 x10E6/uL (ref 4.14–5.80)
RDW: 13.3 % (ref 12.3–15.4)
WBC: 6.3 10*3/uL (ref 3.4–10.8)

## 2014-12-12 LAB — BMP8+EGFR
BUN/Creatinine Ratio: 11 (ref 10–22)
BUN: 9 mg/dL (ref 8–27)
CALCIUM: 9.4 mg/dL (ref 8.6–10.2)
CHLORIDE: 102 mmol/L (ref 97–108)
CO2: 26 mmol/L (ref 18–29)
CREATININE: 0.83 mg/dL (ref 0.76–1.27)
GFR calc Af Amer: 107 mL/min/{1.73_m2} (ref >59)
GFR calc non Af Amer: 92 mL/min/{1.73_m2} (ref >59)
Glucose: 106 mg/dL — ABNORMAL HIGH (ref 65–99)
POTASSIUM: 5.1 mmol/L (ref 3.5–5.2)
SODIUM: 142 mmol/L (ref 134–144)

## 2014-12-12 LAB — HEPATIC FUNCTION PANEL
ALBUMIN: 4.4 g/dL (ref 3.6–4.8)
ALK PHOS: 77 IU/L (ref 39–117)
ALT: 11 IU/L (ref 0–44)
AST: 13 IU/L (ref 0–40)
BILIRUBIN TOTAL: 0.3 mg/dL (ref 0.0–1.2)
Bilirubin, Direct: 0.1 mg/dL (ref 0.00–0.40)
TOTAL PROTEIN: 6.6 g/dL (ref 6.0–8.5)

## 2014-12-12 LAB — VITAMIN D 25 HYDROXY (VIT D DEFICIENCY, FRACTURES): Vit D, 25-Hydroxy: 44.1 ng/mL (ref 30.0–100.0)

## 2014-12-12 LAB — THYROID PANEL WITH TSH
FREE THYROXINE INDEX: 2.9 (ref 1.2–4.9)
T3 Uptake Ratio: 28 % (ref 24–39)
T4 TOTAL: 10.2 ug/dL (ref 4.5–12.0)
TSH: 2.06 u[IU]/mL (ref 0.450–4.500)

## 2014-12-12 LAB — PSA, TOTAL AND FREE
PROSTATE SPECIFIC AG, SERUM: 0.9 ng/mL (ref 0.0–4.0)
PSA FREE: 0.17 ng/mL
PSA, Free Pct: 18.9 %

## 2014-12-16 ENCOUNTER — Encounter: Payer: Self-pay | Admitting: Pharmacist

## 2014-12-16 ENCOUNTER — Ambulatory Visit (INDEPENDENT_AMBULATORY_CARE_PROVIDER_SITE_OTHER): Payer: Medicare Other | Admitting: Pharmacist

## 2014-12-16 VITALS — BP 130/78 | HR 62 | Ht 69.5 in | Wt 161.0 lb

## 2014-12-16 DIAGNOSIS — E785 Hyperlipidemia, unspecified: Secondary | ICD-10-CM | POA: Diagnosis not present

## 2014-12-16 MED ORDER — ROSUVASTATIN CALCIUM 10 MG PO TABS: 10.0000 mg | ORAL_TABLET | Freq: Every day | ORAL | Status: DC

## 2014-12-16 NOTE — Patient Instructions (Signed)
Finish prescription of pravastatin  - take 1 tablet in the evening and then switch to Crestor  take 1 tablet daily.

## 2014-12-16 NOTE — Progress Notes (Signed)
Patient ID: John Leon, male   DOB: [birth year], 64 y.o.   MRN: [id number] Subjective:    John Leon is a 64 y.o. male who presents for evaluation of dyslipidemia. The patient does use medications that may worsen dyslipidemias (corticosteroids, progestins, anabolic steroids, diuretics, beta-blockers, amiodarone, cyclosporine, olanzapine). Exercise: intermittently. Previous history of cardiac disease includes: Cardiomyopathy Coronary Artery Disease Coronary Artery Stent.  Cardiac Risk Factors Age > 45-male, > 55-male:  YES  +1  Smoking:   YES  +1  Sig. family hx of CHD*:  YES  +1  Hypertension:   YES  +1  Diabetes:   NO  HDL < 35:   YES  +1  HDL > 59:   NO  Total:  5   *Significant family history of CHD per NCEP = MI or sudden death at less than 52 year old in father or other 1st-degree male relative, or less than 58 year old in mother or  other 1st-degree male relative  The following portions of the patient's history were reviewed and updated as appropriate: allergies, current medications, past family history, past medical history, past social history, past surgical history and problem list.   Objective:      Lab Review Office Visit on 12/11/2014  Component Date Value  . Prostate Specific Ag, Se* 12/11/2014 0.9   . PSA, Free 12/11/2014 0.17   . PSA, Free Pct 12/11/2014 18.9   . Total Protein 12/11/2014 6.6   . Albumin 12/11/2014 4.4   . Bilirubin Total 12/11/2014 0.3   . Bilirubin, Direct 12/11/2014 0.10   . Alkaline Phosphatase 12/11/2014 77   . AST 12/11/2014 13   . ALT 12/11/2014 11   . Glucose 12/11/2014 106*  . BUN 12/11/2014 9   . Creatinine, Ser 12/11/2014 0.83   . GFR calc non Af Amer 12/11/2014 92   . GFR calc Af Amer 12/11/2014 107   . BUN/Creatinine Ratio 12/11/2014 11   . Sodium 12/11/2014 142   . Potassium 12/11/2014 5.1   . Chloride 12/11/2014 102   . CO2 12/11/2014 26   . Calcium 12/11/2014 9.4   . LDL Particle Number 12/11/2014 [id number]*   . LDL-C 12/11/2014 143*  . HDL Cholesterol by NMR 12/11/2014 37*  . Triglycerides by NMR 12/11/2014 120   . Cholesterol 12/11/2014 204*  . HDL Particle Number 12/11/2014 28.5*  . Small LDL Particle Number 12/11/2014 1052*  . LDL Size 12/11/2014 20.5   . LP-IR Score 12/11/2014 54*  . Vit D, 25-Hydroxy 12/11/2014 44.1   . TSH 12/11/2014 2.060   . T4, Total 12/11/2014 10.2   . T3 Uptake Ratio 12/11/2014 28   . Free Thyroxine Index 12/11/2014 2.9   . WBC 12/11/2014 6.3   . RBC 12/11/2014 5.12   . Hemoglobin 12/11/2014 15.8   . Hematocrit 12/11/2014 47.2   . MCV 12/11/2014 92   . [facility] 12/11/2014 30.9   . MCHC 12/11/2014 33.5   . RDW 12/11/2014 13.3   . Platelets 12/11/2014 211   . Neutrophils 12/11/2014 54   . Lymphs 12/11/2014 35   . Monocytes 12/11/2014 8   . Eos 12/11/2014 2   . Basos 12/11/2014 1   . Neutrophils Absolute 12/11/2014 3.4   . Lymphocytes Absolute 12/11/2014 2.2   . Monocytes Absolute 12/11/2014 0.5   . EOS (ABSOLUTE) 12/11/2014 0.1   . Basophils Absolute 12/11/2014 0.0   . Immature Granulocytes 12/11/2014 0   . Immature Grans (Abs) 12/11/2014 0.0  Assessment:    Dyslipidemia as detailed above with highest risk due to CAD / ACSVD   Target levels for LDL are: < 70 mg/dl ("very high" risk for CHD)  Explained to the patient the respective contributions of genetics, diet, and exercise to lipid levels and the use of medication in severe cases which do not respond to lifestyle alteration. The patient's interest and motivation in making lifestyle changes seems good.     Plan:    The following changes are planned for the next 3 months, at which time the patient will return for repeat fasting lipids:  1. Dietary changes: Increase soluble fiber Reduce saturated fat, "trans" monounsaturated fatty acids, and cholesterol 2. Exercise changes:  Advised to engage in walking and hiking or golf  and frequency goal is 3-4 times a week 3. Other treatment Smoking  cessation (patient not interested ni quitting at this time) 4. Lipid-lowering medications: crestor  - take 1 tablet daily (patient will finish a bottle of pravastatin that he has filled 12/22/13 - has about 15 tablets left and will then start crestor) (Recommended by NCEP after 3-6 months of dietary therapy & lifestyle modification,  except if CHD is present or LDL well above 190.)  Note: The majority of the visit was spent in counseling on the pathophysiology and treatment of dyslipidemias. The total face-to-face time was in excess of 20 minutes.    Henrene Pastor, PharmD, CPP

## 2015-01-02 ENCOUNTER — Other Ambulatory Visit: Payer: Self-pay | Admitting: Cardiology

## 2015-03-10 ENCOUNTER — Encounter: Payer: Self-pay | Admitting: Pharmacist

## 2015-03-10 ENCOUNTER — Ambulatory Visit (INDEPENDENT_AMBULATORY_CARE_PROVIDER_SITE_OTHER): Payer: Medicare Other | Admitting: Pharmacist

## 2015-03-10 VITALS — BP 132/82 | HR 68 | Ht 70.0 in | Wt 165.0 lb

## 2015-03-10 DIAGNOSIS — E785 Hyperlipidemia, unspecified: Secondary | ICD-10-CM

## 2015-03-10 DIAGNOSIS — Z23 Encounter for immunization: Secondary | ICD-10-CM | POA: Diagnosis not present

## 2015-03-10 NOTE — Progress Notes (Signed)
Patient ID: John Leon, male   DOB: [birth year], 64 y.o.   MRN: [id number] Subjective:    John Leon is a 64 y.o. male who presents for reevaluation of dyslipidemia. Mr. John Leon was last seen by myself about 8 weeks ago.  He started rosuvastatin  daily after he finished Rx for pravastatin which was about 30 days ago.  The patient does use medications that may worsen dyslipidemias (corticosteroids, progestins, anabolic steroids, diuretics, beta-blockers, amiodarone, cyclosporine, olanzapine). Exercise: intermittently. Previous history of cardiac disease includes: Cardiomyopathy Coronary Artery Disease Coronary Artery Stent.  Exercise:  Some walking but not regularly Diet:  Tries to limit fried foods and high fat meat.  He eats out a lot at [employer] but tries to get vegetables regularly.    Cardiac Risk Factors Age > 45-male, > 55-male:  YES  +1  Smoking:   YES  +1  Sig. family hx of CHD*:  YES  +1  Hypertension:   YES  +1  Diabetes:   NO  HDL < 35:   YES  +1  HDL > 59:   NO  Total:  5   *Significant family history of CHD per NCEP = MI or sudden death at less than 60 year old in father or other 1st-degree male relative, or less than 34 year old in mother or  other 1st-degree male relative  The following portions of the patient's history were reviewed and updated as appropriate: allergies, current medications, past family history, past medical history, past social history, past surgical history and problem list.   Objective:      Lab Review No visits with results within 2 Month(s) from this visit. Latest known visit with results is:  Office Visit on 12/11/2014  Component Date Value  . Prostate Specific Ag, Se* 12/11/2014 0.9   . PSA, Free 12/11/2014 0.17   . PSA, Free Pct 12/11/2014 18.9   . Total Protein 12/11/2014 6.6   . Albumin 12/11/2014 4.4   . Bilirubin Total 12/11/2014 0.3   . Bilirubin, Direct 12/11/2014 0.10   . Alkaline Phosphatase 12/11/2014 77    . AST 12/11/2014 13   . ALT 12/11/2014 11   . Glucose 12/11/2014 106*  . BUN 12/11/2014 9   . Creatinine, Ser 12/11/2014 0.83   . GFR calc non Af Amer 12/11/2014 92   . GFR calc Af Amer 12/11/2014 107   . BUN/Creatinine Ratio 12/11/2014 11   . Sodium 12/11/2014 142   . Potassium 12/11/2014 5.1   . Chloride 12/11/2014 102   . CO2 12/11/2014 26   . Calcium 12/11/2014 9.4   . LDL Particle Number 12/11/2014 [id number]*  . LDL-C 12/11/2014 143*  . HDL Cholesterol by NMR 12/11/2014 37*  . Triglycerides by NMR 12/11/2014 120   . Cholesterol 12/11/2014 204*  . HDL Particle Number 12/11/2014 28.5*  . Small LDL Particle Number 12/11/2014 1052*  . LDL Size 12/11/2014 20.5   . LP-IR Score 12/11/2014 54*  . Vit D, 25-Hydroxy 12/11/2014 44.1   . TSH 12/11/2014 2.060   . T4, Total 12/11/2014 10.2   . T3 Uptake Ratio 12/11/2014 28   . Free Thyroxine Index 12/11/2014 2.9   . WBC 12/11/2014 6.3   . RBC 12/11/2014 5.12   . Hemoglobin 12/11/2014 15.8   . Hematocrit 12/11/2014 47.2   . MCV 12/11/2014 92   . [facility] 12/11/2014 30.9   . MCHC 12/11/2014 33.5   . RDW 12/11/2014 13.3   . Platelets 12/11/2014 211   .  Neutrophils 12/11/2014 54   . Lymphs 12/11/2014 35   . Monocytes 12/11/2014 8   . Eos 12/11/2014 2   . Basos 12/11/2014 1   . Neutrophils Absolute 12/11/2014 3.4   . Lymphocytes Absolute 12/11/2014 2.2   . Monocytes Absolute 12/11/2014 0.5   . EOS (ABSOLUTE) 12/11/2014 0.1   . Basophils Absolute 12/11/2014 0.0   . Immature Granulocytes 12/11/2014 0   . Immature Grans (Abs) 12/11/2014 0.0       Assessment:    Dyslipidemia as detailed above with highest risk due to CAD / ACSVD   Target levels for LDL are: < 70 mg/dl ("very high" risk for CHD)  Explained to the patient the respective contributions of genetics, diet, and exercise to lipid levels and the use of medication in severe cases which do not respond to lifestyle alteration. The patient's interest and motivation in making  lifestyle changes seems good.     Plan:    The following changes are planned for the next 3 months, at which time the patient will return for repeat fasting lipids:  1. Dietary changes: Continue with low fat diet; increased fiber; increased vegetables 2. Exercise changes: Continue exercise but encouraged to increase frequency and aim for at least 150 minutes per week.  3. Other treatment Smoking cessation (patient not interested ni quitting at this time) 4. Lipid-lowering medications: Continue crestor 10mg  - take 1 tablet daily - checking lipids/LFTs today 5.   Influenza vaccine given in office today  Note: The majority of the visit was spent in counseling on the pathophysiology and treatment of dyslipidemias. The total face-to-face time was in excess of 20 minutes.    Henrene Pastorammy Georganna Maxson, PharmD, CPP

## 2015-03-11 LAB — LIPID PANEL
Chol/HDL Ratio: 3 ratio units (ref 0.0–5.0)
Cholesterol, Total: 120 mg/dL (ref 100–199)
HDL: 40 mg/dL (ref >39)
LDL Calculated: 68 mg/dL (ref 0–99)
Triglycerides: 61 mg/dL (ref 0–149)
VLDL CHOLESTEROL CAL: 12 mg/dL (ref 5–40)

## 2015-03-11 LAB — HEPATIC FUNCTION PANEL
ALT: 15 IU/L (ref 0–44)
AST: 18 IU/L (ref 0–40)
Albumin: 4.4 g/dL (ref 3.6–4.8)
Alkaline Phosphatase: 72 IU/L (ref 39–117)
Bilirubin Total: 0.4 mg/dL (ref 0.0–1.2)
Bilirubin, Direct: 0.13 mg/dL (ref 0.00–0.40)
Total Protein: 6.5 g/dL (ref 6.0–8.5)

## 2015-04-17 ENCOUNTER — Other Ambulatory Visit: Payer: Self-pay | Admitting: Cardiology

## 2015-06-05 ENCOUNTER — Other Ambulatory Visit: Payer: Self-pay | Admitting: Family Medicine

## 2015-06-08 ENCOUNTER — Telehealth: Payer: Self-pay | Admitting: Family Medicine

## 2015-06-08 NOTE — Telephone Encounter (Signed)
Patient due to have lipids checked.  I know Dr Christell Constant always checks but note put in patient's appt notes to check lipids.

## 2015-06-15 ENCOUNTER — Ambulatory Visit (INDEPENDENT_AMBULATORY_CARE_PROVIDER_SITE_OTHER): Payer: Medicare Other | Admitting: Family Medicine

## 2015-06-15 ENCOUNTER — Encounter: Payer: Self-pay | Admitting: Family Medicine

## 2015-06-15 VITALS — BP 117/68 | HR 57 | Temp 97.8°F | Ht 70.0 in | Wt 160.0 lb

## 2015-06-15 DIAGNOSIS — E785 Hyperlipidemia, unspecified: Secondary | ICD-10-CM

## 2015-06-15 DIAGNOSIS — I426 Alcoholic cardiomyopathy: Secondary | ICD-10-CM | POA: Diagnosis not present

## 2015-06-15 DIAGNOSIS — I10 Essential (primary) hypertension: Secondary | ICD-10-CM

## 2015-06-15 DIAGNOSIS — E7800 Pure hypercholesterolemia, unspecified: Secondary | ICD-10-CM | POA: Diagnosis not present

## 2015-06-15 DIAGNOSIS — E559 Vitamin D deficiency, unspecified: Secondary | ICD-10-CM | POA: Diagnosis not present

## 2015-06-15 DIAGNOSIS — N400 Enlarged prostate without lower urinary tract symptoms: Secondary | ICD-10-CM | POA: Diagnosis not present

## 2015-06-15 DIAGNOSIS — I2510 Atherosclerotic heart disease of native coronary artery without angina pectoris: Secondary | ICD-10-CM | POA: Diagnosis not present

## 2015-06-15 NOTE — Progress Notes (Addendum)
Subjective:    Patient ID: John Leon, male    DOB: 1950/10/15, 65 y.o.   MRN: [id number]  HPI Pt here for follow up and management of chronic medical problems which includes hypertension and hyperlipidemia. He is taking medications regularly. The patient is doing well today with no specific complaints. He continues to be followed by the cardiologist. He is due to return an FOBT as well as get his lab work today. On reviewing his last blood work his total LDL particle number was in the 1800 range at that time it was stressed to the patient how important it is for him to take the statin drug. The patient has been alcohol free for 9 years. He still continues to smoke. He was encouraged to stop. He denies any chest pain chest tightness shortness of breath trouble swallowing heartburn indigestion nausea vomiting diarrhea or blood in the stool. He is passing his water without problems. He sees the cardiologist on a regular basis every 18 months. He says his next visit will be due in about a year from now.      Patient Active Problem List   Diagnosis Date Noted  . ASCVD (arteriosclerotic cardiovascular disease) 12/11/2014  . BPH (benign prostatic hypertrophy) 12/11/2014  . Vitamin D deficiency 12/11/2014  . Hypertension 10/27/2013  . History of gastroesophageal reflux (GERD) 10/27/2013  . Nicotine abuse 10/27/2013  . TOBACCO ABUSE 10/08/2008  . Coronary atherosclerosis 10/08/2008  . CARDIOMYOPATHY 10/08/2008  . MURAL THROMBUS 10/08/2008  . HYPERCHOLESTEROLEMIA-PURE 09/22/2008  . CARDIOVASCULAR FUNCTION STUDY, ABNORMAL 09/22/2008   Outpatient Encounter Prescriptions as of 06/15/2015  Medication Sig  . aspirin 81 MG tablet Take 81 mg by mouth daily.    . carvedilol (COREG) 3.125 MG tablet TAKE 1 TABLET (3.125 MG TOTAL) BY MOUTH 2 (TWO) TIMES DAILY.  Marland Kitchen Cholecalciferol (VITAMIN D3) 2000 UNITS TABS Take 1 tablet by mouth daily.    . clopidogrel (PLAVIX) 75 MG tablet TAKE 1 TABLET EVERY  DAY  . lisinopril (PRINIVIL,ZESTRIL) 2.5 MG tablet TAKE 1 TABLET DAILY WITH 5 MG TABLET FOR A TOTAL OF 7.5 MG DAILY  . lisinopril (PRINIVIL,ZESTRIL) 5 MG tablet TAKE ONE TABLET BY MOUTH DAILY ALONG WITH 2.5 MG.  . nitroGLYCERIN (NITROSTAT) 0.4 MG SL tablet Place 0.4 mg under the tongue every 5 (five) minutes as needed.    . rosuvastatin (CRESTOR) 10 MG tablet Take 1 tablet (10 mg total) by mouth daily.   No facility-administered encounter medications on file as of 06/15/2015.     Review of Systems  Constitutional: Negative.   HENT: Negative.   Eyes: Negative.   Respiratory: Negative.   Cardiovascular: Negative.   Gastrointestinal: Negative.   Endocrine: Negative.   Genitourinary: Negative.   Musculoskeletal: Negative.   Skin: Negative.   Allergic/Immunologic: Negative.   Neurological: Negative.   Hematological: Negative.   Psychiatric/Behavioral: Negative.        Objective:   Physical Exam  Constitutional: He is oriented to person, place, and time. He appears well-developed and well-nourished.  Alert and pleasant  HENT:  Head: Normocephalic and atraumatic.  Right Ear: External ear normal.  Left Ear: External ear normal.  Mouth/Throat: Oropharynx is clear and moist. No oropharyngeal exudate.  Slight nasal congestion  Eyes: Conjunctivae and EOM are normal. Pupils are equal, round, and reactive to light. Right eye exhibits no discharge. Left eye exhibits no discharge. No scleral icterus.  Neck: Normal range of motion. Neck supple. No thyromegaly present.  Neck without bruits thyromegaly or  adenopathy  Cardiovascular: Normal rate, regular rhythm, normal heart sounds and intact distal pulses.   No murmur heard. Heart is regular at 60/m  Pulmonary/Chest: Effort normal and breath sounds normal. No respiratory distress. He has no wheezes. He has no rales. He exhibits no tenderness.  The patient had rhonchi and wheezes bilaterally with slightly diminished breath sounds and no chest  wall masses or axillary adenopathy  Abdominal: Soft. Bowel sounds are normal. He exhibits no mass. There is no tenderness. There is no rebound and no guarding.  No liver or spleen enlargement palpable no epigastric tenderness and no suprapubic tenderness and no inguinal adenopathy  Musculoskeletal: Normal range of motion. He exhibits no edema or tenderness.  Lymphadenopathy:    He has no cervical adenopathy.  Neurological: He is alert and oriented to person, place, and time. He has normal reflexes. No cranial nerve deficit.  Skin: Skin is warm and dry. No rash noted.  Psychiatric: He has a normal mood and affect. His behavior is normal. Judgment and thought content normal.  Nursing note and vitals reviewed.  BP 117/68 mmHg  Pulse 57  Temp(Src) 97.8 F (36.6 C) (Oral)  Ht 5' 10"  (1.778 m)  Wt 160 lb (72.576 kg)  BMI 22.96 kg/m2        Assessment & Plan:  1. Hyperlipidemia LDL goal <70 -Continue with Crestor pending results of lab work - CBC with Differential/Platelet - NMR, lipoprofile  2. Essential hypertension -The blood pressure is good today he should continue to refrain from sodium intake and continue with his lisinopril and carvedilol - BMP8+EGFR - CBC with Differential/Platelet - Hepatic function panel  3. Vitamin D deficiency -Continue current treatment pending results of lab work - CBC with Differential/Platelet - VITAMIN D 25 Hydroxy (Vit-D Deficiency, Fractures)  4. BPH (benign prostatic hypertrophy) -The patient is not having any symptoms with passing his water. - CBC with Differential/Platelet  5. HYPERCHOLESTEROLEMIA-PURE -Continue with current cholesterol medicine pending results of lab work  6. Alcoholic cardiomyopathy ([place]) -The patient has not had any alcohol for 9 years  7. ASCVD (arteriosclerotic cardiovascular disease) -Continue follow-up with cardiology, according to patient every 18 months  Patient Instructions                       Medicare  Annual Wellness Visit  [facility] and the medical providers at [facility] strive to bring you the best medical care.  In doing so we not only want to address your current medical conditions and concerns but also to detect new conditions early and prevent illness, disease and health-related problems.    Medicare offers a yearly Wellness Visit which allows our clinical staff to assess your need for preventative services including immunizations, lifestyle education, counseling to decrease risk of preventable diseases and screening for fall risk and other medical concerns.    This visit is provided free of charge (no copay) for all Medicare recipients. The clinical pharmacists at [facility] have begun to conduct these Wellness Visits which will also include a thorough review of all your medications.    As you primary medical provider recommend that you make an appointment for your Annual Wellness Visit if you have not done so already this year.  You may set up this appointment before you leave today or you may call back ([phone]) and schedule an appointment.  Please make sure when you call that you mention that you are scheduling your Annual Wellness  Visit with the clinical pharmacist so that the appointment may be made for the proper length of time.     Continue current medications. Continue good therapeutic lifestyle changes which include good diet and exercise. Fall precautions discussed with patient. If an FOBT was given today- please return it to our front desk. If you are over 85 years old - you may need Prevnar 48 or the adult Pneumonia vaccine.  **Flu shots are available--- please call and schedule a FLU-CLINIC appointment**  After your visit with [place] today you will receive a survey in the mail or online from [employer] regarding your care with [place]. Please take a moment to fill this out. Your feedback is very important to [place] as you can help [place]  better understand your patient needs as well as improve your experience and satisfaction. WE CARE ABOUT YOU!!!   Continue to make all efforts at stopping smoking Continue to refrain from alcohol use as you're doing Take your cholesterol medicine regularly and watch her diet closely and get as much exercise as possible We will call you with your lab work results as soon as these results become available The patient needs to make an appointment and have his eyes examined.   Arrie Senate MD

## 2015-06-15 NOTE — Patient Instructions (Addendum)
Medicare Annual Wellness Visit  [place] and the medical providers at [facility] strive to bring you the best medical care.  In doing so we not only want to address your current medical conditions and concerns but also to detect new conditions early and prevent illness, disease and health-related problems.    Medicare offers a yearly Wellness Visit which allows our clinical staff to assess your need for preventative services including immunizations, lifestyle education, counseling to decrease risk of preventable diseases and screening for fall risk and other medical concerns.    This visit is provided free of charge (no copay) for all Medicare recipients. The clinical pharmacists at [facility] have begun to conduct these Wellness Visits which will also include a thorough review of all your medications.    As you primary medical provider recommend that you make an appointment for your Annual Wellness Visit if you have not done so already this year.  You may set up this appointment before you leave today or you may call back ([phone]) and schedule an appointment.  Please make sure when you call that you mention that you are scheduling your Annual Wellness Visit with the clinical pharmacist so that the appointment may be made for the proper length of time.     Continue current medications. Continue good therapeutic lifestyle changes which include good diet and exercise. Fall precautions discussed with patient. If an FOBT was given today- please return it to our front desk. If you are over 20 years old - you may need Prevnar 13 or the adult Pneumonia vaccine.  **Flu shots are available--- please call and schedule a FLU-CLINIC appointment**  After your visit with [place] today you will receive a survey in the mail or online from [employer] regarding your care with [place]. Please take a moment to fill this out. Your feedback is very  important to [place] as you can help [place] better understand your patient needs as well as improve your experience and satisfaction. WE CARE ABOUT YOU!!!   Continue to make all efforts at stopping smoking Continue to refrain from alcohol use as you're doing Take your cholesterol medicine regularly and watch her diet closely and get as much exercise as possible We will call you with your lab work results as soon as these results become available The patient needs to make an appointment and have his eyes examined.

## 2015-06-16 ENCOUNTER — Telehealth: Payer: Self-pay | Admitting: Family Medicine

## 2015-06-16 LAB — BMP8+EGFR
BUN / CREAT RATIO: 12 (ref 10–22)
BUN: 11 mg/dL (ref 8–27)
CHLORIDE: 100 mmol/L (ref 96–106)
CO2: 25 mmol/L (ref 18–29)
Calcium: 9.3 mg/dL (ref 8.6–10.2)
Creatinine, Ser: 0.94 mg/dL (ref 0.76–1.27)
GFR calc Af Amer: 98 mL/min/{1.73_m2} (ref >59)
GFR, EST NON AFRICAN AMERICAN: 85 mL/min/{1.73_m2} (ref >59)
Glucose: 75 mg/dL (ref 65–99)
Potassium: 5 mmol/L (ref 3.5–5.2)
Sodium: 140 mmol/L (ref 134–144)

## 2015-06-16 LAB — CBC WITH DIFFERENTIAL/PLATELET
Basophils Absolute: 0.1 10*3/uL (ref 0.0–0.2)
Basos: 1 %
EOS (ABSOLUTE): 0.1 10*3/uL (ref 0.0–0.4)
EOS: 2 %
HEMATOCRIT: 47 % (ref 37.5–51.0)
HEMOGLOBIN: 15.7 g/dL (ref 12.6–17.7)
IMMATURE GRANULOCYTES: 0 %
Immature Grans (Abs): 0 10*3/uL (ref 0.0–0.1)
LYMPHS ABS: 2.7 10*3/uL (ref 0.7–3.1)
Lymphs: 39 %
MCH: 30.8 pg (ref 26.6–33.0)
MCHC: 33.4 g/dL (ref 31.5–35.7)
MCV: 92 fL (ref 79–97)
MONOCYTES: 8 %
Monocytes Absolute: 0.5 10*3/uL (ref 0.1–0.9)
Neutrophils Absolute: 3.5 10*3/uL (ref 1.4–7.0)
Neutrophils: 50 %
Platelets: 187 10*3/uL (ref 150–379)
RBC: 5.09 x10E6/uL (ref 4.14–5.80)
RDW: 13.4 % (ref 12.3–15.4)
WBC: 6.9 10*3/uL (ref 3.4–10.8)

## 2015-06-16 LAB — NMR, LIPOPROFILE
Cholesterol: 126 mg/dL (ref 100–199)
HDL Cholesterol by NMR: 42 mg/dL (ref >39)
HDL PARTICLE NUMBER: 32.3 umol/L (ref >=30.5)
LDL Particle Number: 771 nmol/L (ref <1000)
LDL Size: 20.4 nm (ref >20.5)
LDL-C: 69 mg/dL (ref 0–99)
LP-IR SCORE: 44 (ref <=45)
SMALL LDL PARTICLE NUMBER: 401 nmol/L (ref <=527)
Triglycerides by NMR: 76 mg/dL (ref 0–149)

## 2015-06-16 LAB — HEPATIC FUNCTION PANEL
ALBUMIN: 4.3 g/dL (ref 3.6–4.8)
ALK PHOS: 65 IU/L (ref 39–117)
ALT: 11 IU/L (ref 0–44)
AST: 14 IU/L (ref 0–40)
BILIRUBIN TOTAL: 0.4 mg/dL (ref 0.0–1.2)
BILIRUBIN, DIRECT: 0.12 mg/dL (ref 0.00–0.40)
Total Protein: 6.4 g/dL (ref 6.0–8.5)

## 2015-06-16 LAB — VITAMIN D 25 HYDROXY (VIT D DEFICIENCY, FRACTURES): VIT D 25 HYDROXY: 56.7 ng/mL (ref 30.0–100.0)

## 2015-06-16 NOTE — Telephone Encounter (Signed)
Patient aware of results and verbalizes understanding.  

## 2015-06-25 ENCOUNTER — Encounter: Payer: Self-pay | Admitting: *Deleted

## 2015-08-16 ENCOUNTER — Other Ambulatory Visit: Payer: Self-pay | Admitting: Pharmacist

## 2015-09-04 ENCOUNTER — Other Ambulatory Visit: Payer: Self-pay | Admitting: Family Medicine

## 2015-09-12 ENCOUNTER — Other Ambulatory Visit: Payer: Self-pay | Admitting: Cardiology

## 2015-09-13 NOTE — Telephone Encounter (Signed)
Rx request sent to pharmacy.  

## 2015-10-04 ENCOUNTER — Encounter: Payer: Self-pay | Admitting: *Deleted

## 2015-11-09 ENCOUNTER — Other Ambulatory Visit: Payer: Self-pay | Admitting: Cardiology

## 2015-12-20 ENCOUNTER — Other Ambulatory Visit: Payer: Self-pay | Admitting: Family Medicine

## 2015-12-22 ENCOUNTER — Ambulatory Visit: Payer: Medicare Other | Admitting: Family Medicine

## 2015-12-27 ENCOUNTER — Ambulatory Visit: Payer: Medicare Other | Admitting: Family Medicine

## 2016-01-15 ENCOUNTER — Other Ambulatory Visit: Payer: Self-pay | Admitting: Cardiology

## 2016-01-17 NOTE — Telephone Encounter (Signed)
Rx(s) sent to pharmacy electronically.  

## 2016-01-24 ENCOUNTER — Ambulatory Visit (INDEPENDENT_AMBULATORY_CARE_PROVIDER_SITE_OTHER): Payer: Medicare Other

## 2016-01-24 ENCOUNTER — Ambulatory Visit (INDEPENDENT_AMBULATORY_CARE_PROVIDER_SITE_OTHER): Payer: Medicare Other | Admitting: Family Medicine

## 2016-01-24 ENCOUNTER — Encounter: Payer: Self-pay | Admitting: Family Medicine

## 2016-01-24 VITALS — BP 106/70 | HR 59 | Temp 97.9°F | Ht 70.0 in | Wt 156.0 lb

## 2016-01-24 DIAGNOSIS — N400 Enlarged prostate without lower urinary tract symptoms: Secondary | ICD-10-CM

## 2016-01-24 DIAGNOSIS — E785 Hyperlipidemia, unspecified: Secondary | ICD-10-CM | POA: Diagnosis not present

## 2016-01-24 DIAGNOSIS — I10 Essential (primary) hypertension: Secondary | ICD-10-CM

## 2016-01-24 DIAGNOSIS — Z23 Encounter for immunization: Secondary | ICD-10-CM

## 2016-01-24 DIAGNOSIS — F17200 Nicotine dependence, unspecified, uncomplicated: Secondary | ICD-10-CM

## 2016-01-24 DIAGNOSIS — Z720 Tobacco use: Secondary | ICD-10-CM

## 2016-01-24 DIAGNOSIS — I2510 Atherosclerotic heart disease of native coronary artery without angina pectoris: Secondary | ICD-10-CM

## 2016-01-24 DIAGNOSIS — E559 Vitamin D deficiency, unspecified: Secondary | ICD-10-CM | POA: Diagnosis not present

## 2016-01-24 DIAGNOSIS — J410 Simple chronic bronchitis: Secondary | ICD-10-CM

## 2016-01-24 DIAGNOSIS — I426 Alcoholic cardiomyopathy: Secondary | ICD-10-CM

## 2016-01-24 LAB — URINALYSIS, COMPLETE
BILIRUBIN UA: NEGATIVE
Glucose, UA: NEGATIVE
KETONES UA: NEGATIVE
LEUKOCYTES UA: NEGATIVE
Nitrite, UA: NEGATIVE
PH UA: 6 (ref 5.0–7.5)
PROTEIN UA: NEGATIVE
RBC UA: NEGATIVE
SPEC GRAV UA: 1.015 (ref 1.005–1.030)
UUROB: 0.2 mg/dL (ref 0.2–1.0)

## 2016-01-24 LAB — MICROSCOPIC EXAMINATION
BACTERIA UA: NONE SEEN
EPITHELIAL CELLS (NON RENAL): NONE SEEN /HPF (ref 0–10)
RBC, UA: NONE SEEN /hpf (ref 0–?)
WBC, UA: NONE SEEN /hpf (ref 0–?)

## 2016-01-24 NOTE — Progress Notes (Signed)
Subjective:    Patient ID: John Leon, male    DOB: 04-Jul-1950, 65 y.o.   MRN: [id number]  HPI Pt here for follow up and management of chronic medical problems which includes hypertension and hyperlipidemia. He is taking medications regularly.The patient is doing well today with no specific complaints. His vital signs are stable and his weight is down 4 pounds since 6 months ago. He is due to get lab work today and his flu shot today. We will also get a urine and do a rectal exam. He will be given an FOBT to return. The patient has not had any alcohol for 10 years. He goes to [place] for 5 times weekly to help other people were very proud of him for doing that. He denies any chest pain shortness of breath palpitations trouble swallowing heartburn indigestion nausea vomiting diarrhea blood in the stool or black tarry bowel movements. He is passing his water without problems. His family history was reviewed and his father died of a cerebral aneurysm, his mother died of dementia and his brother died of complications from exposure to agent orange in [place].      Patient Active Problem List   Diagnosis Date Noted  . ASCVD (arteriosclerotic cardiovascular disease) 12/11/2014  . BPH (benign prostatic hypertrophy) 12/11/2014  . Vitamin D deficiency 12/11/2014  . Hypertension 10/27/2013  . History of gastroesophageal reflux (GERD) 10/27/2013  . Nicotine abuse 10/27/2013  . TOBACCO ABUSE 10/08/2008  . Coronary atherosclerosis 10/08/2008  . Alcoholic cardiomyopathy ([place]) 10/08/2008  . MURAL THROMBUS 10/08/2008  . HYPERCHOLESTEROLEMIA-PURE 09/22/2008  . CARDIOVASCULAR FUNCTION STUDY, ABNORMAL 09/22/2008   Outpatient Encounter Prescriptions as of 01/24/2016  Medication Sig  . aspirin 81 MG tablet Take 81 mg by mouth daily.    . carvedilol (COREG) 3.125 MG tablet TAKE 1 TABLET (3.125 MG TOTAL) BY MOUTH 2 (TWO) TIMES DAILY.  Marland Kitchen Cholecalciferol (VITAMIN D3) 2000 UNITS TABS Take 1 tablet by mouth  daily.    . clopidogrel (PLAVIX) 75 MG tablet TAKE 1 TABLET EVERY DAY  . lisinopril (PRINIVIL,ZESTRIL) 2.5 MG tablet Take 1 tablet (2.5 mg total) by mouth daily. PLEASE CONTACT OFFICE FOR ADDITIONAL REFILLS  . lisinopril (PRINIVIL,ZESTRIL) 5 MG tablet TAKE ONE TABLET BY MOUTH DAILY ALONG WITH 2.5 MG.  . nitroGLYCERIN (NITROSTAT) 0.4 MG SL tablet Place 0.4 mg under the tongue every 5 (five) minutes as needed.    . rosuvastatin (CRESTOR) 10 MG tablet TAKE 1 TABLET (10 MG TOTAL) BY MOUTH DAILY.   No facility-administered encounter medications on file as of 01/24/2016.       Review of Systems  Constitutional: Negative.   HENT: Negative.   Eyes: Negative.   Respiratory: Negative.   Cardiovascular: Negative.   Gastrointestinal: Negative.   Endocrine: Negative.   Genitourinary: Negative.   Musculoskeletal: Negative.   Skin: Negative.   Allergic/Immunologic: Negative.   Neurological: Negative.   Hematological: Negative.   Psychiatric/Behavioral: Negative.        Objective:   Physical Exam  Constitutional: He is oriented to person, place, and time. He appears well-developed and well-nourished. No distress.  HENT:  Head: Normocephalic and atraumatic.  Right Ear: External ear normal.  Left Ear: External ear normal.  Nose: Nose normal.  Mouth/Throat: Oropharynx is clear and moist. No oropharyngeal exudate.  Eyes: Conjunctivae and EOM are normal. Pupils are equal, round, and reactive to light. Right eye exhibits no discharge. Left eye exhibits no discharge. No scleral icterus.  Neck: Normal range of motion. Neck  supple. No thyromegaly present.  No thyromegaly or bruits or adenopathy  Cardiovascular: Normal rate, regular rhythm, normal heart sounds and intact distal pulses.   No murmur heard. The heart has a regular rate and rhythm at 60/m  Pulmonary/Chest: Effort normal and breath sounds normal. No respiratory distress. He has no wheezes. He has no rales. He exhibits no tenderness.  No  axillary adenopathy Tight congested cough but lungs were clear anteriorly and posteriorly  Abdominal: Soft. Bowel sounds are normal. He exhibits no mass. There is no tenderness. There is no rebound and no guarding.  The abdomen is soft but does have a lot of gaseous distention. There is no liver or spleen enlargement no masses and no bruits. There is no inguinal adenopathy.  Genitourinary: Rectum normal and penis normal.  Genitourinary Comments: The prostate is slightly enlarged without lumps or masses. There is no rectal masses. The external genitalia were within normal limits. There may be a small, very small left inguinal hernia present. We will continue to monitor this.  Musculoskeletal: Normal range of motion. He exhibits no edema or tenderness.  Lymphadenopathy:    He has no cervical adenopathy.  Neurological: He is alert and oriented to person, place, and time. He has normal reflexes. No cranial nerve deficit.  Skin: Skin is warm and dry. No rash noted.  Psychiatric: He has a normal mood and affect. His behavior is normal. Judgment and thought content normal.  Nursing note and vitals reviewed.  BP 106/70 (BP Location: Left Arm)   Pulse (!) 59   Temp 97.9 F (36.6 C) (Oral)   Ht 5' 10"  (1.778 m)   Wt 156 lb (70.8 kg)   BMI 22.38 kg/m   [facility] reading (PRIMARY) by  Dr. Brunilda Payor x-ray with results pending                                        Assessment & Plan:  1. Hyperlipidemia LDL goal <70 -Continue with Crestor and as aggressive therapeutic lifestyle changes as possible - CBC with Differential/Platelet - NMR, lipoprofile  2. Essential hypertension -The blood pressure is good today and he will continue with current treatment - BMP8+EGFR - CBC with Differential/Platelet - Hepatic function panel - DG Chest 2 View; Future  3. Vitamin D deficiency -Continue with vitamin D replacement pending results of lab work - CBC with Differential/Platelet - VITAMIN D 25 Hydroxy  (Vit-D Deficiency, Fractures)  4. BPH (benign prostatic hypertrophy) -The prostate remains slightly enlarged but there is no lumps or masses. The patient is having no voiding symptoms. - CBC with Differential/Platelet - PSA, total and free - Urinalysis, Complete  5. ASCVD (arteriosclerotic cardiovascular disease) -Continue with aggressive therapeutic lifestyle changes and follow-up with cardiology as planned in November - CBC with Differential/Platelet - NMR, lipoprofile - DG Chest 2 View; Future  6. Encounter for immunization - Flu Vaccine QUAD 36+ mos IM  7. Smoker -Continue to make all efforts at smoking cessation - DG Chest 2 View; Future  8. Alcoholic cardiomyopathy ([place]) -Follow-up with cardiology as planned  9. Simple chronic bronchitis ([place]) -Chest x-ray today and make every effort at smoking cessation  Patient Instructions                       Medicare Annual Wellness Visit  [facility] and the medical providers at [facility] strive to  bring you the best medical care.  In doing so we not only want to address your current medical conditions and concerns but also to detect new conditions early and prevent illness, disease and health-related problems.    Medicare offers a yearly Wellness Visit which allows our clinical staff to assess your need for preventative services including immunizations, lifestyle education, counseling to decrease risk of preventable diseases and screening for fall risk and other medical concerns.    This visit is provided free of charge (no copay) for all Medicare recipients. The clinical pharmacists at [facility] have begun to conduct these Wellness Visits which will also include a thorough review of all your medications.    As you primary medical provider recommend that you make an appointment for your Annual Wellness Visit if you have not done so already this year.  You may set up this appointment  before you leave today or you may call back [phone]) and schedule an appointment.  Please make sure when you call that you mention that you are scheduling your Annual Wellness Visit with the clinical pharmacist so that the appointment may be made for the proper length of time.     Continue current medications. Continue good therapeutic lifestyle changes which include good diet and exercise. Fall precautions discussed with patient. If an FOBT was given today- please return it to our front desk. If you are over 39 years old - you may need Prevnar 66 or the adult Pneumonia vaccine.  **Flu shots are available--- please call and schedule a FLU-CLINIC appointment**  After your visit with [place] today you will receive a survey in the mail or online from [employer] regarding your care with [place]. Please take a moment to fill this out. Your feedback is very important to [place] as you can help [place] better understand your patient needs as well as improve your experience and satisfaction. WE CARE ABOUT YOU!!!   The flu shot that you received a day may make your arm sore, so please be aware of this. Please return the FOBT We will call you with the results of the lab work as soon as those results become available Follow-up with cardiology as planned Make every effort possible to stop smoking Continue to drink plenty of fluids and stay well hydrated   Arrie Senate MD

## 2016-01-24 NOTE — Patient Instructions (Addendum)
Medicare Annual Wellness Visit  [facility] and the medical providers at [facility] strive to bring you the best medical care.  In doing so we not only want to address your current medical conditions and concerns but also to detect new conditions early and prevent illness, disease and health-related problems.    Medicare offers a yearly Wellness Visit which allows our clinical staff to assess your need for preventative services including immunizations, lifestyle education, counseling to decrease risk of preventable diseases and screening for fall risk and other medical concerns.    This visit is provided free of charge (no copay) for all Medicare recipients. The clinical pharmacists at [facility] have begun to conduct these Wellness Visits which will also include a thorough review of all your medications.    As you primary medical provider recommend that you make an appointment for your Annual Wellness Visit if you have not done so already this year.  You may set up this appointment before you leave today or you may call back [phone] and schedule an appointment.  Please make sure when you call that you mention that you are scheduling your Annual Wellness Visit with the clinical pharmacist so that the appointment may be made for the proper length of time.     Continue current medications. Continue good therapeutic lifestyle changes which include good diet and exercise. Fall precautions discussed with patient. If an FOBT was given today- please return it to our front desk. If you are over 66 years old - you may need Prevnar 13 or the adult Pneumonia vaccine.  **Flu shots are available--- please call and schedule a FLU-CLINIC appointment**  After your visit with us today you will receive a survey in the mail or online from [employer] regarding your care with us. Please take a moment to fill this out. Your feedback is very  important to us as you can help us better understand your patient needs as well as improve your experience and satisfaction. WE CARE ABOUT YOU!!!   The flu shot that you received a day may make your arm sore, so please be aware of this. Please return the FOBT We will call you with the results of the lab work as soon as those results become available Follow-up with cardiology as planned Make every effort possible to stop smoking Continue to drink plenty of fluids and stay well hydrated

## 2016-01-26 ENCOUNTER — Telehealth: Payer: Self-pay

## 2016-01-26 NOTE — Telephone Encounter (Signed)
[facility] to x-ray 

## 2016-01-27 ENCOUNTER — Other Ambulatory Visit: Payer: Self-pay | Admitting: Family Medicine

## 2016-01-27 ENCOUNTER — Ambulatory Visit (INDEPENDENT_AMBULATORY_CARE_PROVIDER_SITE_OTHER): Payer: Medicare Other

## 2016-01-27 DIAGNOSIS — Z09 Encounter for follow-up examination after completed treatment for conditions other than malignant neoplasm: Secondary | ICD-10-CM | POA: Diagnosis not present

## 2016-01-31 LAB — CBC WITH DIFFERENTIAL/PLATELET
BASOS: 0 %
Basophils Absolute: 0 10*3/uL (ref 0.0–0.2)
EOS (ABSOLUTE): 0.1 10*3/uL (ref 0.0–0.4)
EOS: 2 %
Hematocrit: 46.3 % (ref 37.5–51.0)
Hemoglobin: 15.7 g/dL (ref 12.6–17.7)
IMMATURE GRANS (ABS): 0 10*3/uL (ref 0.0–0.1)
IMMATURE GRANULOCYTES: 0 %
LYMPHS: 34 %
Lymphocytes Absolute: 2.4 10*3/uL (ref 0.7–3.1)
MCH: 31.5 pg (ref 26.6–33.0)
MCHC: 33.9 g/dL (ref 31.5–35.7)
MCV: 93 fL (ref 79–97)
MONOCYTES: 8 %
MONOS ABS: 0.5 10*3/uL (ref 0.1–0.9)
NEUTROS PCT: 56 %
Neutrophils Absolute: 3.9 10*3/uL (ref 1.4–7.0)
PLATELETS: 187 10*3/uL (ref 150–379)
RBC: 4.99 x10E6/uL (ref 4.14–5.80)
RDW: 13.9 % (ref 12.3–15.4)
WBC: 6.9 10*3/uL (ref 3.4–10.8)

## 2016-01-31 LAB — BMP8+EGFR
BUN/Creatinine Ratio: 11 (ref 10–24)
BUN: 11 mg/dL (ref 8–27)
CO2: 27 mmol/L (ref 18–29)
CREATININE: 1.03 mg/dL (ref 0.76–1.27)
Calcium: 9.1 mg/dL (ref 8.6–10.2)
Chloride: 99 mmol/L (ref 96–106)
GFR calc Af Amer: 87 mL/min/{1.73_m2} (ref >59)
GFR, EST NON AFRICAN AMERICAN: 75 mL/min/{1.73_m2} (ref >59)
Glucose: 82 mg/dL (ref 65–99)
Potassium: 4.4 mmol/L (ref 3.5–5.2)
SODIUM: 138 mmol/L (ref 134–144)

## 2016-01-31 LAB — NMR, LIPOPROFILE
CHOLESTEROL: 148 mg/dL (ref 100–199)
HDL CHOLESTEROL BY NMR: 44 mg/dL (ref >39)
HDL Particle Number: 35.1 umol/L (ref >=30.5)
LDL Particle Number: 1264 nmol/L — ABNORMAL HIGH (ref <1000)
LDL SIZE: 20.6 nm (ref >20.5)
LDL-C: 85 mg/dL (ref 0–99)
LP-IR Score: 52 — ABNORMAL HIGH (ref <=45)
SMALL LDL PARTICLE NUMBER: 652 nmol/L — AB (ref <=527)
TRIGLYCERIDES BY NMR: 94 mg/dL (ref 0–149)

## 2016-01-31 LAB — HEPATIC FUNCTION PANEL
ALBUMIN: 4.5 g/dL (ref 3.6–4.8)
ALT: 8 IU/L (ref 0–44)
AST: 11 IU/L (ref 0–40)
Alkaline Phosphatase: 73 IU/L (ref 39–117)
BILIRUBIN TOTAL: 0.4 mg/dL (ref 0.0–1.2)
BILIRUBIN, DIRECT: 0.13 mg/dL (ref 0.00–0.40)
TOTAL PROTEIN: 6.7 g/dL (ref 6.0–8.5)

## 2016-01-31 LAB — VITAMIN D 25 HYDROXY (VIT D DEFICIENCY, FRACTURES): Vit D, 25-Hydroxy: 52.7 ng/mL (ref 30.0–100.0)

## 2016-01-31 LAB — PSA, TOTAL AND FREE
PSA, Free Pct: 20 %
PSA, Free: 0.24 ng/mL
Prostate Specific Ag, Serum: 1.2 ng/mL (ref 0.0–4.0)

## 2016-02-15 ENCOUNTER — Other Ambulatory Visit: Payer: Self-pay | Admitting: Cardiology

## 2016-02-15 MED ORDER — LISINOPRIL 5 MG PO TABS: ORAL_TABLET | ORAL | 2 refills | Status: DC

## 2016-02-28 ENCOUNTER — Other Ambulatory Visit: Payer: Self-pay | Admitting: Family Medicine

## 2016-03-28 ENCOUNTER — Encounter: Payer: Self-pay | Admitting: Cardiology

## 2016-03-28 NOTE — Progress Notes (Signed)
HPI The patient presents for follow up of CAD.  Since I last saw him he has he has done well.  He says that he bought a treadmill and has been exercising recently on it.  The patient denies any new symptoms such as chest discomfort, neck or arm discomfort. There has been no new shortness of breath, PND or orthopnea. There have been no reported palpitations, presyncope or syncope.  He is still smoking but has been sober for 124 months.   Allergies  Allergen Reactions  . Penicillins     REACTION: Reaction not known    Current Outpatient Prescriptions  Medication Sig Dispense Refill  . aspirin 81 MG tablet Take 81 mg by mouth daily.      . carvedilol (COREG) 3.125 MG tablet TAKE 1 TABLET (3.125 MG TOTAL) BY MOUTH 2 (TWO) TIMES DAILY. 180 tablet 0  . Cholecalciferol (VITAMIN D3) 2000 UNITS TABS Take 1 tablet by mouth daily.      . clopidogrel (PLAVIX) 75 MG tablet TAKE 1 TABLET EVERY DAY 30 tablet 5  . lisinopril (PRINIVIL,ZESTRIL) 2.5 MG tablet TAKE 1 TABLET DAILY WITH 5 MG TABLET FOR A TOTAL OF 7.5 MG DAILY 30 tablet 2  . lisinopril (PRINIVIL,ZESTRIL) 5 MG tablet TAKE 1 TABLET DAILY WITH 2.5 MG TABLET FOR A TOTAL OF 7.5 MG DAILY 30 tablet 2  . nitroGLYCERIN (NITROSTAT) 0.4 MG SL tablet Place 0.4 mg under the tongue every 5 (five) minutes as needed.      . rosuvastatin (CRESTOR) 10 MG tablet TAKE 1 TABLET (10 MG TOTAL) BY MOUTH DAILY. 90 tablet 1   No current facility-administered medications for this visit.     Past Medical History:  Diagnosis Date  . Coronary artery disease    s/p DES to LAD, occluded RCA  . H/O: alcohol abuse   . Hyperlipidemia   . Ischemic cardiomyopathy   . Post-infarction apical thrombus [facility](HCC)     Past Surgical History:  Procedure Laterality Date  . CORONARY STENT PLACEMENT N/A 08/2008  . None      ROS:    As stated in the HPI and negative for all other systems.  PHYSICAL EXAM BP 126/74   Pulse (!) 58   Ht 5\' 8"  (1.727 m)   Wt 160 lb (72.6 kg)    SpO2 99%   BMI 24.33 kg/m  GENERAL:  Well appearing HEENT:  Pupils equal round and reactive, fundi not visualized, oral mucosa unremarkable NECK:  No jugular venous distention, waveform within normal limits, carotid upstroke brisk and symmetric, no bruits, no thyromegaly LUNGS:  Clear to auscultation bilaterally BACK:  No CVA tenderness CHEST:  Unremarkable HEART:  PMI not displaced or sustained,S1 and S2 within normal limits, no S3, no S4, no clicks, no rubs, no murmurs ABD:  Flat, positive bowel sounds normal in frequency in pitch, no bruits, no rebound, no guarding, no midline pulsatile mass, no hepatomegaly, no splenomegaly EXT:  2 plus pulses throughout, no edema, no cyanosis no clubbing   EKG:  Sinus bradycardia, rate 57, old inferior infarct, lateral T-wave inversions unchanged from previous.  Mild dysuria as widening in a nonspecific pattern unchanged from previous  03/29/2016  ASSESSMENT AND PLAN  CAD:  The patient has no new symptoms.  Because of his ongoing tobacco abuse I will continue his Plavix and other meds as listed. He is at higher risk for future cardiovascular events with this ongoing risk factor and he and I have discussed this at length.  I want him to have a screening POET ([employer] Exercise Treadmill) but he wants to wait until June.  I will schedule this.   TOBACCO ABUSE:  We discussed the need to stop smoking but he says he will be unable to do this. He does not want further medical therapy.  ISCHEMIC CARDIOMYOPATHY:  His EF has been very mildly reduced in the past. However, he has no symptoms. He wouldn't tolerate further med titration with low blood pressure. No change in therapy is indicated.  DYSLIPIDEMIA:  This is followed and managed by Dr. Christell ConstantMoore.  He was not taking his Crestor routinely and his numbers reflected this recently.  He started taking this recently and Dr. Christell ConstantMoore will follow up.

## 2016-03-29 ENCOUNTER — Ambulatory Visit (INDEPENDENT_AMBULATORY_CARE_PROVIDER_SITE_OTHER): Payer: Medicare Other | Admitting: Cardiology

## 2016-03-29 ENCOUNTER — Encounter: Payer: Self-pay | Admitting: Cardiology

## 2016-03-29 VITALS — BP 126/74 | HR 58 | Ht 68.0 in | Wt 160.0 lb

## 2016-03-29 DIAGNOSIS — I2510 Atherosclerotic heart disease of native coronary artery without angina pectoris: Secondary | ICD-10-CM

## 2016-03-29 DIAGNOSIS — I10 Essential (primary) hypertension: Secondary | ICD-10-CM

## 2016-03-29 NOTE — Patient Instructions (Signed)
Medication Instructions:  The current medical regimen is effective;  continue present plan and medications.  Testing/Procedures: Your physician has requested that you have an exercise tolerance test. For further information please visit [email or web address]. Please also follow instruction sheet, as given.  Follow-Up: Follow up in June 2018 for a treadmill test.  You will receive a letter in the mail 2 months before you are due.  Please call us when you receive this letter to schedule your follow up appointment.  If you need a refill on your cardiac medications before your next appointment, please call your pharmacy.  Thank you for choosing [facility]!!

## 2016-05-23 ENCOUNTER — Other Ambulatory Visit: Payer: Self-pay | Admitting: Cardiology

## 2016-05-23 NOTE — Telephone Encounter (Signed)
REFILL 

## 2016-05-25 ENCOUNTER — Other Ambulatory Visit: Payer: Self-pay | Admitting: Family Medicine

## 2016-06-10 ENCOUNTER — Other Ambulatory Visit: Payer: Self-pay | Admitting: Cardiology

## 2016-07-12 ENCOUNTER — Other Ambulatory Visit: Payer: Self-pay | Admitting: Family Medicine

## 2016-07-25 ENCOUNTER — Ambulatory Visit: Payer: Medicare Other | Admitting: Family Medicine

## 2016-08-21 ENCOUNTER — Other Ambulatory Visit: Payer: Self-pay | Admitting: Family Medicine

## 2016-08-28 ENCOUNTER — Telehealth: Payer: Self-pay | Admitting: Cardiology

## 2016-08-28 MED ORDER — NITROGLYCERIN 0.4 MG SL SUBL: 0.4000 mg | SUBLINGUAL_TABLET | SUBLINGUAL | 1 refills | Status: DC | PRN

## 2016-08-28 NOTE — Telephone Encounter (Signed)
New message        *STAT* If patient is at the pharmacy, call can be transferred to refill team.   1. Which medications need to be refilled? (please list name of each medication and dose if known)  nitro 2. Which pharmacy/location (including street and city if local pharmacy) is medication to be sent to? [facility] 3. Do they need a 30 day or 90 day supply? 30

## 2016-08-28 NOTE — Telephone Encounter (Signed)
Refill submitted to patient's preferred pharmacy.  

## 2016-08-29 ENCOUNTER — Other Ambulatory Visit: Payer: Self-pay | Admitting: *Deleted

## 2016-08-29 MED ORDER — LISINOPRIL 2.5 MG PO TABS: 2.5000 mg | ORAL_TABLET | Freq: Every day | ORAL | 1 refills | Status: DC

## 2016-08-29 MED ORDER — LISINOPRIL 5 MG PO TABS: 5.0000 mg | ORAL_TABLET | Freq: Every day | ORAL | 1 refills | Status: DC

## 2016-09-05 ENCOUNTER — Ambulatory Visit: Payer: Medicare Other | Admitting: Family Medicine

## 2016-10-03 ENCOUNTER — Ambulatory Visit (INDEPENDENT_AMBULATORY_CARE_PROVIDER_SITE_OTHER): Payer: Medicare Other | Admitting: Family Medicine

## 2016-10-03 ENCOUNTER — Encounter: Payer: Self-pay | Admitting: Family Medicine

## 2016-10-03 VITALS — BP 123/72 | HR 55 | Temp 97.2°F | Ht 68.0 in | Wt 156.0 lb

## 2016-10-03 DIAGNOSIS — E785 Hyperlipidemia, unspecified: Secondary | ICD-10-CM

## 2016-10-03 DIAGNOSIS — Z1159 Encounter for screening for other viral diseases: Secondary | ICD-10-CM | POA: Diagnosis not present

## 2016-10-03 DIAGNOSIS — E559 Vitamin D deficiency, unspecified: Secondary | ICD-10-CM

## 2016-10-03 DIAGNOSIS — I426 Alcoholic cardiomyopathy: Secondary | ICD-10-CM

## 2016-10-03 DIAGNOSIS — I10 Essential (primary) hypertension: Secondary | ICD-10-CM | POA: Diagnosis not present

## 2016-10-03 DIAGNOSIS — F17200 Nicotine dependence, unspecified, uncomplicated: Secondary | ICD-10-CM

## 2016-10-03 DIAGNOSIS — I2510 Atherosclerotic heart disease of native coronary artery without angina pectoris: Secondary | ICD-10-CM | POA: Diagnosis not present

## 2016-10-03 NOTE — Progress Notes (Signed)
 Subjective:    Patient ID: John Leon, male    DOB: 12/05/1950, 66 y.o.   MRN: [id number]  HPI Pt here for follow up and management of chronic medical problems which includes hyperlipidemia and hypertension. He is taking medication regularly.[name] comes in today for his regular office visit. He has no specific complaints. He is an alcoholic and has been alcohol free for several years and he is to be complemented on this. He has no requests for refills today. He will be given an FOBT to return and will get lab work today. His vital signs are stable and his weight is actually down 4 pounds since last visit. He sees the cardiologist yearly. His last lipid panel had an LDL C that was 85 and should be less than 70. His BMP and LFTs were all good and within normal limits and the vitamin D level was good previously. The patient is in good spirits. All of his review of systems are negative including no chest pain no shortness of breath no trouble swallowing no heartburn indigestion nausea vomiting diarrhea or blood in the stool. He is passing his water without problems. He is had no alcohol and 11 years! He is pleasant and alert and in good spirits. He does need to get an eye exam and he is aware of this.     Patient Active Problem List   Diagnosis Date Noted  . ASCVD (arteriosclerotic cardiovascular disease) 12/11/2014  . BPH (benign prostatic hypertrophy) 12/11/2014  . Vitamin D deficiency 12/11/2014  . Hypertension 10/27/2013  . History of gastroesophageal reflux (GERD) 10/27/2013  . Nicotine abuse 10/27/2013  . TOBACCO ABUSE 10/08/2008  . Coronary atherosclerosis 10/08/2008  . Alcoholic cardiomyopathy (HCC) 10/08/2008  . MURAL THROMBUS 10/08/2008  . HYPERCHOLESTEROLEMIA-PURE 09/22/2008  . CARDIOVASCULAR FUNCTION STUDY, ABNORMAL 09/22/2008   Outpatient Encounter Prescriptions as of 10/03/2016  Medication Sig  . aspirin 81 MG tablet Take 81 mg by mouth daily.    . carvedilol (COREG)  3.125 MG tablet TAKE 1 TABLET (3.125 MG TOTAL) BY MOUTH 2 (TWO) TIMES DAILY.  . Cholecalciferol (VITAMIN D3) 2000 UNITS TABS Take 1 tablet by mouth daily.    . clopidogrel (PLAVIX) 75 MG tablet TAKE 1 TABLET EVERY DAY  . lisinopril (PRINIVIL,ZESTRIL) 2.5 MG tablet Take 1 tablet (2.5 mg total) by mouth daily.  . lisinopril (PRINIVIL,ZESTRIL) 5 MG tablet Take 1 tablet (5 mg total) by mouth daily. TAKE ONE TABLET DAILY ALONG WITH 2.5 MG TO MAKE 7.5 MG.  . nitroGLYCERIN (NITROSTAT) 0.4 MG SL tablet Place 1 tablet (0.4 mg total) under the tongue every 5 (five) minutes as needed.  . rosuvastatin (CRESTOR) 10 MG tablet TAKE 1 TABLET (10 MG TOTAL) BY MOUTH DAILY.   No facility-administered encounter medications on file as of 10/03/2016.      Review of Systems  Constitutional: Negative.   HENT: Negative.   Eyes: Negative.   Respiratory: Negative.   Cardiovascular: Negative.   Gastrointestinal: Negative.   Endocrine: Negative.   Genitourinary: Negative.   Musculoskeletal: Negative.   Skin: Negative.   Allergic/Immunologic: Negative.   Neurological: Negative.   Hematological: Negative.   Psychiatric/Behavioral: Negative.        Objective:   Physical Exam  Constitutional: He is oriented to person, place, and time. He appears well-developed and well-nourished. No distress.  Pleasant and alert  HENT:  Head: Normocephalic and atraumatic.  Right Ear: External ear normal.  Left Ear: External ear normal.  Nose: Nose normal.  Mouth/Throat:   Oropharynx is clear and moist. No oropharyngeal exudate.  Eyes: Conjunctivae and EOM are normal. Pupils are equal, round, and reactive to light. Right eye exhibits no discharge. Left eye exhibits no discharge. No scleral icterus.  Neck: Normal range of motion. Neck supple. No thyromegaly present.  Cardiovascular: Normal rate, regular rhythm, normal heart sounds and intact distal pulses.   No murmur heard. The heart is regular at 60/m  Pulmonary/Chest: Effort  normal. No respiratory distress. He has no wheezes. He has no rales. He exhibits no tenderness.  No axillary adenopathy Slightly decreased breath sounds bilaterally  Abdominal: Soft. Bowel sounds are normal. He exhibits no mass. There is no tenderness. There is no rebound and no guarding.  Abdomen is nontender without masses or organ enlargement or bruits  Musculoskeletal: Normal range of motion. He exhibits no edema.  Lymphadenopathy:    He has no cervical adenopathy.  Neurological: He is alert and oriented to person, place, and time. He has normal reflexes. No cranial nerve deficit.  Skin: Skin is warm and dry. No rash noted.  Psychiatric: He has a normal mood and affect. His behavior is normal. Judgment and thought content normal.  Nursing note and vitals reviewed.   BP 123/72 (BP Location: Left Arm)   Pulse (!) 55   Temp 97.2 F (36.2 C) (Oral)   Ht 5' 8" (1.727 m)   Wt 156 lb (70.8 kg)   BMI 23.72 kg/m        Assessment & Plan:  1. Hyperlipidemia LDL goal <70 -Continue current treatment and aggressive therapeutic lifestyle changes pending results of lab work - CBC with Differential/Platelet - Lipid panel  2. Essential hypertension -The blood pressure is good today and he will continue with his current treatment plan - CBC with Differential/Platelet - BMP8+EGFR - Hepatic function panel  3. Vitamin D deficiency -Continue with vitamin D replacement pending results of lab work - CBC with Differential/Platelet - VITAMIN D 25 Hydroxy (Vit-D Deficiency, Fractures)  4. ASCVD (arteriosclerotic cardiovascular disease) -Continue with aggressive therapeutic lifestyle changes and follow-up with cardiology as planned -Make every effort to try to stop smoking - CBC with Differential/Platelet - Lipid panel  5. Smoker -Try to reduce and stop smoking if possible - CBC with Differential/Platelet  6. Encounter for hepatitis C screening test for low risk patient - CBC with  Differential/Platelet - Hepatitis C antibody  7. Alcoholic cardiomyopathy -The patient has been alcohol free for 11 years and he is to be congratulated about that if we could just get him to stop smoking that would even be better.  Patient Instructions                       Medicare Annual Wellness Visit  [facility] and the medical providers at [facility] strive to bring you the best medical care.  In doing so we not only want to address your current medical conditions and concerns but also to detect new conditions early and prevent illness, disease and health-related problems.    Medicare offers a yearly Wellness Visit which allows our clinical staff to assess your need for preventative services including immunizations, lifestyle education, counseling to decrease risk of preventable diseases and screening for fall risk and other medical concerns.    This visit is provided free of charge (no copay) for all Medicare recipients. The clinical pharmacists at [facility] have begun to conduct these Wellness Visits which will also include a thorough  review of all your medications.    As you primary medical provider recommend that you make an appointment for your Annual Wellness Visit if you have not done so already this year.  You may set up this appointment before you leave today or you may call back ([phone]) and schedule an appointment.  Please make sure when you call that you mention that you are scheduling your Annual Wellness Visit with the clinical pharmacist so that the appointment may be made for the proper length of time.     Continue current medications. Continue good therapeutic lifestyle changes which include good diet and exercise. Fall precautions discussed with patient. If an FOBT was given today- please return it to our front desk. If you are over 50 years old - you may need Prevnar 13 or the adult Pneumonia vaccine.  **Flu shots are  available--- please call and schedule a FLU-CLINIC appointment**  After your visit with us today you will receive a survey in the mail or online from [employer] regarding your care with us. Please take a moment to fill this out. Your feedback is very important to us as you can help us better understand your patient needs as well as improve your experience and satisfaction. WE CARE ABOUT YOU!!!  Keep appointment with cardiology at his request to return and time which are be about one year from now Stay active physically Drink plenty of fluids and stay well hydrated Continue current medicines Do not forget to get your eye exam and make sure that they send us a copy of that report Make every effort at stopping smoking  Don W. Moore MD   

## 2016-10-03 NOTE — Patient Instructions (Addendum)
Medicare Annual Wellness Visit  [facility] and the medical providers at [facility] strive to bring you the best medical care.  In doing so we not only want to address your current medical conditions and concerns but also to detect new conditions early and prevent illness, disease and health-related problems.    Medicare offers a yearly Wellness Visit which allows our clinical staff to assess your need for preventative services including immunizations, lifestyle education, counseling to decrease risk of preventable diseases and screening for fall risk and other medical concerns.    This visit is provided free of charge (no copay) for all Medicare recipients. The clinical pharmacists at [facility] have begun to conduct these Wellness Visits which will also include a thorough review of all your medications.    As you primary medical provider recommend that you make an appointment for your Annual Wellness Visit if you have not done so already this year.  You may set up this appointment before you leave today or you may call back [phone] and schedule an appointment.  Please make sure when you call that you mention that you are scheduling your Annual Wellness Visit with the clinical pharmacist so that the appointment may be made for the proper length of time.     Continue current medications. Continue good therapeutic lifestyle changes which include good diet and exercise. Fall precautions discussed with patient. If an FOBT was given today- please return it to our front desk. If you are over 67 years old - you may need Prevnar 13 or the adult Pneumonia vaccine.  **Flu shots are available--- please call and schedule a FLU-CLINIC appointment**  After your visit with us today you will receive a survey in the mail or online from [employer] regarding your care with us. Please take a moment to fill this out. Your feedback is very  important to us as you can help us better understand your patient needs as well as improve your experience and satisfaction. WE CARE ABOUT YOU!!!  Keep appointment with cardiology at his request to return and time which are be about one year from now Stay active physically Drink plenty of fluids and stay well hydrated Continue current medicines Do not forget to get your eye exam and make sure that they send us a copy of that report Make every effort at stopping smoking

## 2016-10-04 LAB — BMP8+EGFR
BUN/Creatinine Ratio: 12 (ref 10–24)
BUN: 11 mg/dL (ref 8–27)
CO2: 25 mmol/L (ref 18–29)
Calcium: 9.3 mg/dL (ref 8.6–10.2)
Chloride: 99 mmol/L (ref 96–106)
Creatinine, Ser: 0.91 mg/dL (ref 0.76–1.27)
GFR calc Af Amer: 100 mL/min/{1.73_m2} (ref >59)
GFR, EST NON AFRICAN AMERICAN: 87 mL/min/{1.73_m2} (ref >59)
GLUCOSE: 69 mg/dL (ref 65–99)
POTASSIUM: 4.7 mmol/L (ref 3.5–5.2)
Sodium: 140 mmol/L (ref 134–144)

## 2016-10-04 LAB — CBC WITH DIFFERENTIAL/PLATELET
BASOS ABS: 0 10*3/uL (ref 0.0–0.2)
Basos: 1 %
EOS (ABSOLUTE): 0.1 10*3/uL (ref 0.0–0.4)
Eos: 2 %
Hematocrit: 48.7 % (ref 37.5–51.0)
Hemoglobin: 16.9 g/dL (ref 13.0–17.7)
Immature Grans (Abs): 0 10*3/uL (ref 0.0–0.1)
Immature Granulocytes: 0 %
LYMPHS ABS: 2.5 10*3/uL (ref 0.7–3.1)
Lymphs: 37 %
MCH: 31.8 pg (ref 26.6–33.0)
MCHC: 34.7 g/dL (ref 31.5–35.7)
MCV: 92 fL (ref 79–97)
MONOS ABS: 0.6 10*3/uL (ref 0.1–0.9)
Monocytes: 9 %
Neutrophils Absolute: 3.5 10*3/uL (ref 1.4–7.0)
Neutrophils: 51 %
Platelets: 194 10*3/uL (ref 150–379)
RBC: 5.32 x10E6/uL (ref 4.14–5.80)
RDW: 13.7 % (ref 12.3–15.4)
WBC: 6.7 10*3/uL (ref 3.4–10.8)

## 2016-10-04 LAB — HEPATIC FUNCTION PANEL
ALBUMIN: 4.7 g/dL (ref 3.6–4.8)
ALT: 12 IU/L (ref 0–44)
AST: 17 IU/L (ref 0–40)
Alkaline Phosphatase: 70 IU/L (ref 39–117)
Bilirubin Total: 0.5 mg/dL (ref 0.0–1.2)
Bilirubin, Direct: 0.13 mg/dL (ref 0.00–0.40)
Total Protein: 6.8 g/dL (ref 6.0–8.5)

## 2016-10-04 LAB — LIPID PANEL
Chol/HDL Ratio: 2.9 ratio (ref 0.0–5.0)
Cholesterol, Total: 120 mg/dL (ref 100–199)
HDL: 42 mg/dL (ref >39)
LDL CALC: 64 mg/dL (ref 0–99)
Triglycerides: 71 mg/dL (ref 0–149)
VLDL Cholesterol Cal: 14 mg/dL (ref 5–40)

## 2016-10-04 LAB — VITAMIN D 25 HYDROXY (VIT D DEFICIENCY, FRACTURES): Vit D, 25-Hydroxy: 52.6 ng/mL (ref 30.0–100.0)

## 2016-10-04 LAB — HEPATITIS C ANTIBODY: Hep C Virus Ab: <0.1 s/co ratio (ref 0.0–0.9)

## 2016-10-08 ENCOUNTER — Other Ambulatory Visit: Payer: Self-pay | Admitting: Family Medicine

## 2016-10-11 ENCOUNTER — Encounter: Payer: Self-pay | Admitting: Family Medicine

## 2016-10-11 DIAGNOSIS — H5211 Myopia, right eye: Secondary | ICD-10-CM | POA: Diagnosis not present

## 2016-11-08 ENCOUNTER — Telehealth: Payer: Self-pay | Admitting: Cardiology

## 2016-11-08 NOTE — Telephone Encounter (Signed)
Left message to call back  

## 2016-11-08 NOTE — Telephone Encounter (Signed)
Returned call to patient-reports he is aware he is due for follow up and stress test but wondering how much test will be prior to scheduling.  Advised I can seek out assitance from billing and return call.  Patient verbalized understanding.

## 2016-11-08 NOTE — Telephone Encounter (Signed)
Follow up ° ° °Pt returning call to RN °

## 2016-11-08 NOTE — Telephone Encounter (Signed)
New message    Pt is calling to find out the necessity of having this test done. Please call.

## 2016-11-09 NOTE — Telephone Encounter (Addendum)
Spoke with billing, CPT codes given, advised to have patient call insurance company with codes to see how much it will cost him.    Spoke to patient-CPT and ICD code given to him.   Advised to contact insurance company for specific numbers.   Patient requesting [place] to scheduling [facility].   Reports he is doing well, no symptoms and is following with his PCP every 6 months as well.  Offered first available to patient with [place], will be out of town.    Scheduled for 9/19 at 11AM with Dr. [place].  Advised I would make Dr. Antoine PocheHochrein aware of situation.

## 2016-11-22 ENCOUNTER — Other Ambulatory Visit: Payer: Self-pay | Admitting: Cardiology

## 2016-11-25 ENCOUNTER — Other Ambulatory Visit: Payer: Self-pay | Admitting: Family Medicine

## 2016-12-13 ENCOUNTER — Ambulatory Visit (INDEPENDENT_AMBULATORY_CARE_PROVIDER_SITE_OTHER): Payer: Medicare Other

## 2016-12-13 DIAGNOSIS — I10 Essential (primary) hypertension: Secondary | ICD-10-CM | POA: Diagnosis not present

## 2016-12-13 LAB — EXERCISE TOLERANCE TEST
CSEPED: 4 min
CSEPEDS: 56 s
CSEPHR: 65 %
CSEPPHR: 100 {beats}/min
Estimated workload: 6.9 METS
MPHR: 153 {beats}/min
RPE: 100
Rest HR: 57 {beats}/min

## 2017-01-16 NOTE — Progress Notes (Signed)
HPI The patient presents for follow up of CAD.  Since I last saw him he has done well.  He did have a POET (Plain Old Exercise Treadmill) in August.  However, it was submaximal.  At the HR achieved there was no evidence of ischemia. The patient denies any new symptoms such as chest discomfort, neck or arm discomfort. There has been no new shortness of breath, PND or orthopnea. There have been no reported palpitations, presyncope or syncope.  He cannot stop smoking.  He is not exercising as much as I would like.    Allergies  Allergen Reactions  . Penicillins     REACTION: Reaction not known    Current Outpatient Prescriptions  Medication Sig Dispense Refill  . aspirin 81 MG tablet Take 81 mg by mouth daily.      . carvedilol (COREG) 3.125 MG tablet TAKE 1 TABLET (3.125 MG TOTAL) BY MOUTH 2 (TWO) TIMES DAILY. 180 tablet 0  . Cholecalciferol (VITAMIN D3) 2000 UNITS TABS Take 1 tablet by mouth daily.      . clopidogrel (PLAVIX) 75 MG tablet TAKE 1 TABLET EVERY DAY 30 tablet 5  . lisinopril (PRINIVIL,ZESTRIL) 2.5 MG tablet Take 1 tablet (2.5 mg total) by mouth daily. 90 tablet 1  . lisinopril (PRINIVIL,ZESTRIL) 5 MG tablet Take 1 tablet (5 mg total) by mouth daily. TAKE ONE TABLET DAILY ALONG WITH 2.5 MG TO MAKE 7.5 MG. 90 tablet 1  . nitroGLYCERIN (NITROSTAT) 0.4 MG SL tablet Place 1 tablet (0.4 mg total) under the tongue every 5 (five) minutes as needed. 25 tablet 1  . rosuvastatin (CRESTOR) 10 MG tablet TAKE 1 TABLET (10 MG TOTAL) BY MOUTH DAILY. 90 tablet 1   No current facility-administered medications for this visit.     Past Medical History:  Diagnosis Date  . Coronary artery disease    s/p DES to LAD, occluded RCA  . H/O: alcohol abuse   . Hyperlipidemia   . Ischemic cardiomyopathy   . Post-infarction apical thrombus [facility])     Past Surgical History:  Procedure Laterality Date  . CORONARY STENT PLACEMENT N/A 08/2008  . None      ROS:    As stated in the HPI and negative  for all other systems.     PHYSICAL EXAM BP 126/80   Pulse 64   Ht  (1.753 m)   Wt 156 lb (70.8 kg)   BMI 23.04 kg/m   GENERAL:  Well appearing NECK:  No jugular venous distention, waveform within normal limits, carotid upstroke brisk and symmetric, no bruits, no thyromegaly LUNGS:  Clear to auscultation bilaterally CHEST:  Unremarkable HEART:  PMI not displaced or sustained,S1 and S2 within normal limits, no S3, no S4, no clicks, no rubs, no murmurs ABD:  Flat, positive bowel sounds normal in frequency in pitch, no bruits, no rebound, no guarding, no midline pulsatile mass, no hepatomegaly, no splenomegaly EXT:  2 plus pulses throughout, no edema, no cyanosis no clubbing   EKG:  Sinus bradycardia, rate 64  old inferior infarct, lateral T-wave inversions unchanged from previous.  Mild QRS widening in a nonspecific pattern unchanged from previous.  PVCs  01/17/2017  ASSESSMENT AND PLAN  CAD:  He has no symptoms.  Despite the fact that this was a submaximal stress test there was no ischemia is not typically get to a higher level of exertion that this so I think that no change in therapy or further testing is indicated. We talked about  the benefits of even low level exercise.  Of note he and I discussed the risks benefits of dual antiplatelet therapy. Given his ongoing risk factors I think the benefits of continuing dual therapy outweighs the risks and he understands and agrees with this.1  TOBACCO ABUSE:  He is unable to stop smoking and we have talked about this at length.    ISCHEMIC CARDIOMYOPATHY:   His EF was mildly low in the past.  He is euvolemic on exam.  No further testing is indicated.   DYSLIPIDEMIA:  He had an LDL of 64 and HDL of 42.    He will remain on the meds as listed.

## 2017-01-17 ENCOUNTER — Ambulatory Visit (INDEPENDENT_AMBULATORY_CARE_PROVIDER_SITE_OTHER): Payer: Medicare Other | Admitting: Cardiology

## 2017-01-17 ENCOUNTER — Encounter: Payer: Self-pay | Admitting: Cardiology

## 2017-01-17 VITALS — BP 126/80 | HR 64 | Ht 69.0 in | Wt 156.0 lb

## 2017-01-17 DIAGNOSIS — I2510 Atherosclerotic heart disease of native coronary artery without angina pectoris: Secondary | ICD-10-CM

## 2017-01-17 DIAGNOSIS — Z720 Tobacco use: Secondary | ICD-10-CM | POA: Diagnosis not present

## 2017-01-17 DIAGNOSIS — I255 Ischemic cardiomyopathy: Secondary | ICD-10-CM | POA: Diagnosis not present

## 2017-01-17 DIAGNOSIS — E785 Hyperlipidemia, unspecified: Secondary | ICD-10-CM | POA: Diagnosis not present

## 2017-01-17 NOTE — Patient Instructions (Signed)

## 2017-01-18 ENCOUNTER — Encounter: Payer: Self-pay | Admitting: Cardiology

## 2017-01-18 DIAGNOSIS — I2510 Atherosclerotic heart disease of native coronary artery without angina pectoris: Secondary | ICD-10-CM | POA: Insufficient documentation

## 2017-01-18 DIAGNOSIS — I255 Ischemic cardiomyopathy: Secondary | ICD-10-CM | POA: Insufficient documentation

## 2017-02-23 ENCOUNTER — Other Ambulatory Visit: Payer: Self-pay | Admitting: Family Medicine

## 2017-03-07 ENCOUNTER — Other Ambulatory Visit: Payer: Self-pay | Admitting: Cardiology

## 2017-03-07 NOTE — Telephone Encounter (Signed)
Rx has been sent to the pharmacy electronically. ° °

## 2017-03-21 ENCOUNTER — Other Ambulatory Visit: Payer: Self-pay | Admitting: Cardiology

## 2017-03-21 NOTE — Telephone Encounter (Signed)
REFILL 

## 2017-04-04 ENCOUNTER — Encounter: Payer: Self-pay | Admitting: Family Medicine

## 2017-04-04 ENCOUNTER — Ambulatory Visit: Payer: Medicare Other | Admitting: Family Medicine

## 2017-04-04 VITALS — BP 108/68 | HR 59 | Temp 97.2°F | Ht 69.0 in | Wt 155.0 lb

## 2017-04-04 DIAGNOSIS — E785 Hyperlipidemia, unspecified: Secondary | ICD-10-CM | POA: Diagnosis not present

## 2017-04-04 DIAGNOSIS — I2510 Atherosclerotic heart disease of native coronary artery without angina pectoris: Secondary | ICD-10-CM | POA: Diagnosis not present

## 2017-04-04 DIAGNOSIS — I10 Essential (primary) hypertension: Secondary | ICD-10-CM | POA: Diagnosis not present

## 2017-04-04 DIAGNOSIS — I255 Ischemic cardiomyopathy: Secondary | ICD-10-CM

## 2017-04-04 DIAGNOSIS — E559 Vitamin D deficiency, unspecified: Secondary | ICD-10-CM

## 2017-04-04 DIAGNOSIS — N400 Enlarged prostate without lower urinary tract symptoms: Secondary | ICD-10-CM | POA: Diagnosis not present

## 2017-04-04 DIAGNOSIS — Z23 Encounter for immunization: Secondary | ICD-10-CM

## 2017-04-04 LAB — URINALYSIS, COMPLETE
BILIRUBIN UA: NEGATIVE
GLUCOSE, UA: NEGATIVE
KETONES UA: NEGATIVE
LEUKOCYTES UA: NEGATIVE
Nitrite, UA: NEGATIVE
PROTEIN UA: NEGATIVE
RBC UA: NEGATIVE
SPEC GRAV UA: 1.01 (ref 1.005–1.030)
Urobilinogen, Ur: 0.2 mg/dL (ref 0.2–1.0)
pH, UA: 5.5 (ref 5.0–7.5)

## 2017-04-04 LAB — MICROSCOPIC EXAMINATION
Bacteria, UA: NONE SEEN
EPITHELIAL CELLS (NON RENAL): NONE SEEN /HPF (ref 0–10)
RBC, UA: NONE SEEN /hpf (ref 0–?)
Renal Epithel, UA: NONE SEEN /hpf
WBC, UA: NONE SEEN /hpf (ref 0–?)

## 2017-04-04 NOTE — Progress Notes (Signed)
Subjective:    Patient ID: John Leon, male    DOB: Jun 01, 1950, 66 y.o.   MRN: [id number]  HPI Pt here for follow up and management of chronic medical problems which includes hypertension and hyperlipidemia. He is taking medication regularly.  This patient has a history of an alcoholic cardiomyopathy.  He does see the cardiologist regularly.  He has coronary atherosclerosis BPH and hypertension and hyperlipidemia.  He also has vitamin D deficiency.  He is taking vitamin D replacement Plavix Coreg and lisinopril.  He also takes Crestor for his lipids.  Last saw the cardiologist in September and had a submaximal stress test with no ischemia.  He continues to smoke and the cardiologist felt that course this needs to stop and that his ejection fraction was slightly decreased in the past but did not feel any further testing was necessary.  He will follow-up with him again in 1 year.  Patient denies any chest pain or shortness of breath.  He denies any trouble with swallowing heartburn indigestion nausea vomiting diarrhea or blood in the stool and is passing his water well.  He is now semiretired but continues to do work around tax season every year.  He is self-employed.  He has a treadmill at home but says he does not use it as often as he should.  He continues to refrain from alcohol and we are so proud of him for that.     Patient Active Problem List   Diagnosis Date Noted  . Coronary artery disease involving native coronary artery of native heart without angina pectoris 01/18/2017  . Ischemic cardiomyopathy 01/18/2017  . ASCVD (arteriosclerotic cardiovascular disease) 12/11/2014  . BPH (benign prostatic hyperplasia) 12/11/2014  . Vitamin D deficiency 12/11/2014  . Hypertension 10/27/2013  . History of gastroesophageal reflux (GERD) 10/27/2013  . Nicotine abuse 10/27/2013  . TOBACCO ABUSE 10/08/2008  . Coronary atherosclerosis 10/08/2008  . Alcoholic cardiomyopathy ([place]) 10/08/2008  .  MURAL THROMBUS 10/08/2008  . HYPERCHOLESTEROLEMIA-PURE 09/22/2008  . CARDIOVASCULAR FUNCTION STUDY, ABNORMAL 09/22/2008   Outpatient Encounter Medications as of 04/04/2017  Medication Sig  . aspirin 81 MG tablet Take 81 mg by mouth daily.    . carvedilol (COREG) 3.125 MG tablet TAKE 1 TABLET (3.125 MG TOTAL) BY MOUTH 2 (TWO) TIMES DAILY.  Marland Kitchen Cholecalciferol (VITAMIN D3) 2000 UNITS TABS Take 1 tablet by mouth daily.    . clopidogrel (PLAVIX) 75 MG tablet TAKE 1 TABLET EVERY DAY  . lisinopril (PRINIVIL,ZESTRIL) 2.5 MG tablet TAKE 1 TABLET BY MOUTH EVERY DAY  . lisinopril (PRINIVIL,ZESTRIL) 5 MG tablet TAKE 1 TABLET DAILY. TAKE ALONG WITH 2.5 MG TABLET TO MAKE 7.5 MG.  . nitroGLYCERIN (NITROSTAT) 0.4 MG SL tablet Place 1 tablet (0.4 mg total) under the tongue every 5 (five) minutes as needed.  . rosuvastatin (CRESTOR) 10 MG tablet TAKE 1 TABLET (10 MG TOTAL) BY MOUTH DAILY.   No facility-administered encounter medications on file as of 04/04/2017.      Review of Systems  Constitutional: Negative.   HENT: Negative.   Eyes: Negative.   Respiratory: Negative.   Cardiovascular: Negative.   Gastrointestinal: Negative.   Endocrine: Negative.   Genitourinary: Negative.   Musculoskeletal: Negative.   Skin: Negative.   Allergic/Immunologic: Negative.   Neurological: Negative.   Hematological: Negative.   Psychiatric/Behavioral: Negative.        Objective:   Physical Exam  Constitutional: He is oriented to person, place, and time. He appears well-developed and well-nourished. No distress.  The patient is alert and pleasant.  HENT:  Head: Normocephalic and atraumatic.  Right Ear: External ear normal.  Left Ear: External ear normal.  Nose: Nose normal.  Mouth/Throat: Oropharynx is clear and moist. No oropharyngeal exudate.  Eyes: Conjunctivae and EOM are normal. Pupils are equal, round, and reactive to light. Right eye exhibits no discharge. Left eye exhibits no discharge. No scleral  icterus.  Neck: Normal range of motion. Neck supple. No thyromegaly present.  No bruits thyromegaly or anterior cervical adenopathy  Cardiovascular: Normal rate, regular rhythm, normal heart sounds and intact distal pulses.  No murmur heard. Heart is regular at 60/min  Pulmonary/Chest: Effort normal and breath sounds normal. No respiratory distress. He has no wheezes. He has no rales. He exhibits no tenderness.  The chest is clear anteriorly and posteriorly and there is no axillary adenopathy  Abdominal: Soft. Bowel sounds are normal. He exhibits no mass. There is no tenderness. There is no rebound and no guarding.  No abdominal tenderness masses organ enlargement or bruits  Genitourinary: Rectum normal and penis normal.  Genitourinary Comments: The prostate remains slightly enlarged without lumps or masses.  There were no rectal masses.  There were no inguinal hernias present in the external genitalia were within normal limits.  Musculoskeletal: Normal range of motion. He exhibits no edema.  Lymphadenopathy:    He has no cervical adenopathy.  Neurological: He is alert and oriented to person, place, and time. He has normal reflexes. No cranial nerve deficit.  Skin: Skin is warm and dry. No rash noted.  Psychiatric: He has a normal mood and affect. His behavior is normal. Judgment and thought content normal.  Nursing note and vitals reviewed.  BP 108/68 (BP Location: Left Arm)   Pulse (!) 59   Temp (!) 97.2 F (36.2 C) (Oral)   Ht 5' 9"  (1.753 m)   Wt 155 lb (70.3 kg)   BMI 22.89 kg/m         Assessment & Plan:  1. Hyperlipidemia LDL goal <70 -Continue with Crestor and as aggressive therapeutic lifestyle changes as possible which include diet and exercise - CBC with Differential/Platelet - Lipid panel  2. Essential hypertension -Blood pressure is good today and he will continue with his current dose of lisinopril. - BMP8+EGFR - CBC with Differential/Platelet - Hepatic  function panel  3. Vitamin D deficiency -10 you with vitamin D replacement pending results of lab work - CBC with Differential/Platelet - VITAMIN D 25 Hydroxy (Vit-D Deficiency, Fractures)  4. ASCVD (arteriosclerotic cardiovascular disease) -Continue with follow-up with the cardiologist as planned with the next visit being next September. - CBC with Differential/Platelet  5. Benign prostatic hyperplasia, unspecified whether lower urinary tract symptoms present -No symptoms with prostate enlargement.  No burning or complaints. - CBC with Differential/Platelet - PSA, total and free - Urinalysis, Complete  6. Ischemic cardiomyopathy -Follow-up with cardiology as planned and continue with as aggressive therapeutic lifestyle changes as possible  Patient Instructions                       Medicare Annual Wellness Visit  [facility] and the medical providers at [facility] strive to bring you the best medical care.  In doing so we not only want to address your current medical conditions and concerns but also to detect new conditions early and prevent illness, disease and health-related problems.    Medicare offers a yearly Wellness Visit which allows our clinical staff  to assess your need for preventative services including immunizations, lifestyle education, counseling to decrease risk of preventable diseases and screening for fall risk and other medical concerns.    This visit is provided free of charge (no copay) for all Medicare recipients. The clinical pharmacists at [facility] have begun to conduct these Wellness Visits which will also include a thorough review of all your medications.    As you primary medical provider recommend that you make an appointment for your Annual Wellness Visit if you have not done so already this year.  You may set up this appointment before you leave today or you may call back [phone]) and schedule an  appointment.  Please make sure when you call that you mention that you are scheduling your Annual Wellness Visit with the clinical pharmacist so that the appointment may be made for the proper length of time.     Continue current medications. Continue good therapeutic lifestyle changes which include good diet and exercise. Fall precautions discussed with patient. If an FOBT was given today- please return it to our front desk. If you are over 37 years old - you may need Prevnar 50 or the adult Pneumonia vaccine.  **Flu shots are available--- please call and schedule a FLU-CLINIC appointment**  After your visit with [place] today you will receive a survey in the mail or online from [employer] regarding your care with [place]. Please take a moment to fill this out. Your feedback is very important to [place] as you can help [place] better understand your patient needs as well as improve your experience and satisfaction. WE CARE ABOUT YOU!!!   Continue to work at stopping smoking completely Follow-up with cardiology as planned next September Remember to get your flu shot regularly.   Arrie Senate MD

## 2017-04-04 NOTE — Patient Instructions (Addendum)
Medicare Annual Wellness Visit  [facility] and the medical providers at [facility] strive to bring you the best medical care.  In doing so we not only want to address your current medical conditions and concerns but also to detect new conditions early and prevent illness, disease and health-related problems.    Medicare offers a yearly Wellness Visit which allows our clinical staff to assess your need for preventative services including immunizations, lifestyle education, counseling to decrease risk of preventable diseases and screening for fall risk and other medical concerns.    This visit is provided free of charge (no copay) for all Medicare recipients. The clinical pharmacists at [facility] have begun to conduct these Wellness Visits which will also include a thorough review of all your medications.    As you primary medical provider recommend that you make an appointment for your Annual Wellness Visit if you have not done so already this year.  You may set up this appointment before you leave today or you may call back [phone] and schedule an appointment.  Please make sure when you call that you mention that you are scheduling your Annual Wellness Visit with the clinical pharmacist so that the appointment may be made for the proper length of time.     Continue current medications. Continue good therapeutic lifestyle changes which include good diet and exercise. Fall precautions discussed with patient. If an FOBT was given today- please return it to our front desk. If you are over 67 years old - you may need Prevnar 13 or the adult Pneumonia vaccine.  **Flu shots are available--- please call and schedule a FLU-CLINIC appointment**  After your visit with us today you will receive a survey in the mail or online from [employer] regarding your care with us. Please take a moment to fill this out. Your feedback is very  important to us as you can help us better understand your patient needs as well as improve your experience and satisfaction. WE CARE ABOUT YOU!!!   Continue to work at stopping smoking completely Follow-up with cardiology as planned next September Remember to get your flu shot regularly.

## 2017-04-05 LAB — CBC WITH DIFFERENTIAL/PLATELET
Basophils Absolute: 0 10*3/uL (ref 0.0–0.2)
Basos: 0 %
EOS (ABSOLUTE): 0.1 10*3/uL (ref 0.0–0.4)
Eos: 2 %
Hematocrit: 47.1 % (ref 37.5–51.0)
Hemoglobin: 16.1 g/dL (ref 13.0–17.7)
IMMATURE GRANULOCYTES: 0 %
Immature Grans (Abs): 0 10*3/uL (ref 0.0–0.1)
Lymphocytes Absolute: 2.6 10*3/uL (ref 0.7–3.1)
Lymphs: 38 %
MCH: 31.3 pg (ref 26.6–33.0)
MCHC: 34.2 g/dL (ref 31.5–35.7)
MCV: 92 fL (ref 79–97)
MONOS ABS: 0.5 10*3/uL (ref 0.1–0.9)
Monocytes: 7 %
NEUTROS PCT: 53 %
Neutrophils Absolute: 3.6 10*3/uL (ref 1.4–7.0)
PLATELETS: 207 10*3/uL (ref 150–379)
RBC: 5.14 x10E6/uL (ref 4.14–5.80)
RDW: 13.7 % (ref 12.3–15.4)
WBC: 6.9 10*3/uL (ref 3.4–10.8)

## 2017-04-05 LAB — VITAMIN D 25 HYDROXY (VIT D DEFICIENCY, FRACTURES): VIT D 25 HYDROXY: 64 ng/mL (ref 30.0–100.0)

## 2017-04-05 LAB — LIPID PANEL
CHOL/HDL RATIO: 2.7 ratio (ref 0.0–5.0)
Cholesterol, Total: 116 mg/dL (ref 100–199)
HDL: 43 mg/dL (ref >39)
LDL Calculated: 59 mg/dL (ref 0–99)
Triglycerides: 68 mg/dL (ref 0–149)
VLDL CHOLESTEROL CAL: 14 mg/dL (ref 5–40)

## 2017-04-05 LAB — BMP8+EGFR
BUN/Creatinine Ratio: 9 — ABNORMAL LOW (ref 10–24)
BUN: 8 mg/dL (ref 8–27)
CO2: 26 mmol/L (ref 20–29)
CREATININE: 0.88 mg/dL (ref 0.76–1.27)
Calcium: 9.2 mg/dL (ref 8.6–10.2)
Chloride: 98 mmol/L (ref 96–106)
GFR, EST AFRICAN AMERICAN: 103 mL/min/{1.73_m2} (ref >59)
GFR, EST NON AFRICAN AMERICAN: 89 mL/min/{1.73_m2} (ref >59)
Glucose: 78 mg/dL (ref 65–99)
Potassium: 4.7 mmol/L (ref 3.5–5.2)
SODIUM: 139 mmol/L (ref 134–144)

## 2017-04-05 LAB — HEPATIC FUNCTION PANEL
ALT: 12 IU/L (ref 0–44)
AST: 18 IU/L (ref 0–40)
Albumin: 4.8 g/dL (ref 3.6–4.8)
Alkaline Phosphatase: 74 IU/L (ref 39–117)
BILIRUBIN, DIRECT: 0.16 mg/dL (ref 0.00–0.40)
Bilirubin Total: 0.5 mg/dL (ref 0.0–1.2)
TOTAL PROTEIN: 6.8 g/dL (ref 6.0–8.5)

## 2017-04-05 LAB — PSA, TOTAL AND FREE
PROSTATE SPECIFIC AG, SERUM: 1.3 ng/mL (ref 0.0–4.0)
PSA, Free Pct: 20 %
PSA, Free: 0.26 ng/mL

## 2017-04-06 ENCOUNTER — Other Ambulatory Visit: Payer: Self-pay | Admitting: Family Medicine

## 2017-05-13 ENCOUNTER — Other Ambulatory Visit: Payer: Self-pay | Admitting: Cardiology

## 2017-06-11 ENCOUNTER — Other Ambulatory Visit: Payer: Self-pay | Admitting: Family Medicine

## 2017-09-04 ENCOUNTER — Other Ambulatory Visit: Payer: Self-pay | Admitting: Cardiology

## 2017-09-08 ENCOUNTER — Other Ambulatory Visit: Payer: Self-pay | Admitting: Family Medicine

## 2017-10-03 ENCOUNTER — Encounter: Payer: Self-pay | Admitting: Family Medicine

## 2017-10-03 ENCOUNTER — Ambulatory Visit: Payer: Medicare Other | Admitting: Family Medicine

## 2017-10-03 VITALS — BP 117/71 | HR 48 | Temp 97.1°F | Ht 69.0 in | Wt 154.0 lb

## 2017-10-03 DIAGNOSIS — E559 Vitamin D deficiency, unspecified: Secondary | ICD-10-CM

## 2017-10-03 DIAGNOSIS — J410 Simple chronic bronchitis: Secondary | ICD-10-CM | POA: Diagnosis not present

## 2017-10-03 DIAGNOSIS — F17200 Nicotine dependence, unspecified, uncomplicated: Secondary | ICD-10-CM

## 2017-10-03 DIAGNOSIS — I426 Alcoholic cardiomyopathy: Secondary | ICD-10-CM | POA: Diagnosis not present

## 2017-10-03 DIAGNOSIS — I10 Essential (primary) hypertension: Secondary | ICD-10-CM

## 2017-10-03 DIAGNOSIS — E785 Hyperlipidemia, unspecified: Secondary | ICD-10-CM | POA: Diagnosis not present

## 2017-10-03 DIAGNOSIS — I255 Ischemic cardiomyopathy: Secondary | ICD-10-CM | POA: Diagnosis not present

## 2017-10-03 DIAGNOSIS — N400 Enlarged prostate without lower urinary tract symptoms: Secondary | ICD-10-CM | POA: Diagnosis not present

## 2017-10-03 DIAGNOSIS — I2510 Atherosclerotic heart disease of native coronary artery without angina pectoris: Secondary | ICD-10-CM | POA: Diagnosis not present

## 2017-10-03 NOTE — Patient Instructions (Addendum)
Medicare Annual Wellness Visit  [facility] and the medical providers at [facility] strive to bring you the best medical care.  In doing so we not only want to address your current medical conditions and concerns but also to detect new conditions early and prevent illness, disease and health-related problems.    Medicare offers a yearly Wellness Visit which allows our clinical staff to assess your need for preventative services including immunizations, lifestyle education, counseling to decrease risk of preventable diseases and screening for fall risk and other medical concerns.    This visit is provided free of charge (no copay) for all Medicare recipients. The clinical pharmacists at [facility] have begun to conduct these Wellness Visits which will also include a thorough review of all your medications.    As you primary medical provider recommend that you make an appointment for your Annual Wellness Visit if you have not done so already this year.  You may set up this appointment before you leave today or you may call back [phone] and schedule an appointment.  Please make sure when you call that you mention that you are scheduling your Annual Wellness Visit with the clinical pharmacist so that the appointment may be made for the proper length of time.     Continue current medications. Continue good therapeutic lifestyle changes which include good diet and exercise. Fall precautions discussed with patient. If an FOBT was given today- please return it to our front desk. If you are over 68 years old - you may need Prevnar 13 or the adult Pneumonia vaccine.  **Flu shots are available--- please call and schedule a FLU-CLINIC appointment**  After your visit with us today you will receive a survey in the mail or online from [employer] regarding your care with us. Please take a moment to fill this out. Your feedback is very  important to us as you can help us better understand your patient needs as well as improve your experience and satisfaction. WE CARE ABOUT YOU!!!   Continue to follow-up with cardiology as recommended Colonoscopy will be due and September 2021 Make every effort to stop smoking Drink plenty of water Watch diet closely Stay active physically

## 2017-10-03 NOTE — Progress Notes (Signed)
Subjective:    Patient ID: John Leon, male    DOB: 04-13-1951, 67 y.o.   MRN: [id number]  HPI Pt here for follow up and management of chronic medical problems which includes hyperlipidemia and hypertension. He is taking medication regularly.  Patient is doing well overall with no specific complaints.  He has a history of an alcoholic cardiomyopathy and sees the cardiologist regularly.  Also has BPH and hypertension and vitamin D deficiency.  He is taking carvedilol Plavix Crestor lisinopril and has nitroglycerin to use if needed along with taking vitamin D3.  His vital signs are stable and his BMI is perfect at 22.89.  The patient today is doing well with no complaints whatsoever.  No shortness of breath no chest pain pressure or tightness no change in bowel habits no nausea vomiting diarrhea blood in the stool or black tarry bowel movements.  No trouble passing his water.  He did have an eye exam in the past year and only uses reading glasses for vision purposes.  He is supposed to see the cardiologist soon.  He plans to make an appointment.  He did have a stress test following his last cardiology visit and the patient says this was good.  He has a normal BMI.     Patient Active Problem List   Diagnosis Date Noted  . Coronary artery disease involving native coronary artery of native heart without angina pectoris 01/18/2017  . Ischemic cardiomyopathy 01/18/2017  . ASCVD (arteriosclerotic cardiovascular disease) 12/11/2014  . BPH (benign prostatic hyperplasia) 12/11/2014  . Vitamin D deficiency 12/11/2014  . Hypertension 10/27/2013  . History of gastroesophageal reflux (GERD) 10/27/2013  . Nicotine abuse 10/27/2013  . TOBACCO ABUSE 10/08/2008  . Coronary atherosclerosis 10/08/2008  . Alcoholic cardiomyopathy ([name]) 10/08/2008  . MURAL THROMBUS 10/08/2008  . HYPERCHOLESTEROLEMIA-PURE 09/22/2008  . CARDIOVASCULAR FUNCTION STUDY, ABNORMAL 09/22/2008   Outpatient Encounter Medications  as of 10/03/2017  Medication Sig  . aspirin 81 MG tablet Take 81 mg by mouth daily.    . carvedilol (COREG) 3.125 MG tablet TAKE 1 TABLET (3.125 MG TOTAL) BY MOUTH 2 (TWO) TIMES DAILY.  Marland Kitchen Cholecalciferol (VITAMIN D3) 2000 UNITS TABS Take 1 tablet by mouth daily.    . clopidogrel (PLAVIX) 75 MG tablet TAKE 1 TABLET EVERY DAY  . lisinopril (PRINIVIL,ZESTRIL) 2.5 MG tablet TAKE 1 TABLET BY MOUTH EVERY DAY  . lisinopril (PRINIVIL,ZESTRIL) 5 MG tablet TAKE 1 TABLET DAILY. TAKE ALONG WITH 2.5 MG TABLET TO MAKE 7.5 MG.  . nitroGLYCERIN (NITROSTAT) 0.4 MG SL tablet Place 1 tablet (0.4 mg total) under the tongue every 5 (five) minutes as needed.  . rosuvastatin (CRESTOR) 10 MG tablet TAKE 1 TABLET (10 MG TOTAL) BY MOUTH DAILY.   No facility-administered encounter medications on file as of 10/03/2017.      Review of Systems  Constitutional: Negative.   HENT: Negative.   Eyes: Negative.   Respiratory: Negative.   Cardiovascular: Negative.   Gastrointestinal: Negative.   Endocrine: Negative.   Genitourinary: Negative.   Musculoskeletal: Negative.   Skin: Negative.   Allergic/Immunologic: Negative.   Neurological: Negative.   Hematological: Negative.   Psychiatric/Behavioral: Negative.        Objective:   Physical Exam  Constitutional: He is oriented to person, place, and time. He appears well-developed and well-nourished. No distress.  The patient is pleasant upbeat and relaxed.  He is now 12 years post alcohol.  He does still smoke and says he is working on that.  HENT:  Head: Normocephalic and atraumatic.  Right Ear: External ear normal.  Left Ear: External ear normal.  Nose: Nose normal.  Mouth/Throat: Oropharynx is clear and moist. No oropharyngeal exudate.  Eyes: Pupils are equal, round, and reactive to light. Conjunctivae and EOM are normal. Right eye exhibits no discharge. Left eye exhibits no discharge. No scleral icterus.  Neck: Normal range of motion. Neck supple. No thyromegaly  present.  No bruits thyromegaly or anterior cervical adenopathy  Cardiovascular: Normal rate, regular rhythm, normal heart sounds and intact distal pulses.  No murmur heard. Heart has a regular rate and rhythm at 60/min  Pulmonary/Chest: Effort normal and breath sounds normal. He has no wheezes. He has no rales. He exhibits no tenderness.  Rare wheeze otherwise clear anteriorly and posteriorly and no axillary adenopathy no chest wall masses.  Abdominal: Soft. Bowel sounds are normal. He exhibits distension. He exhibits no mass. There is no tenderness.  No abdominal tenderness masses or organ enlargement.  He does have a lot of gaseous distention.  He says this is normal for him in the morning.  I reminded him to watch more closely milk cheese and ice cream products.  Musculoskeletal: Normal range of motion. He exhibits no edema.  Lymphadenopathy:    He has no cervical adenopathy.  Neurological: He is alert and oriented to person, place, and time. He has normal reflexes. No cranial nerve deficit.  Skin: Skin is warm and dry. No rash noted.  No suspicious skin lesions observed.  Psychiatric: He has a normal mood and affect. His behavior is normal. Judgment and thought content normal.  Patient is upbeat and positive.  Nursing note and vitals reviewed.    BP 117/71 (BP Location: Left Arm)   Pulse (!) 48   Temp (!) 97.1 F (36.2 C) (Oral)   Ht 5' 9"  (1.753 m)   Wt 154 lb (69.9 kg)   BMI 22.74 kg/m        Assessment & Plan:  1. Vitamin D deficiency -Continue with vitamin D replacement pending results of lab work - CBC with Differential/Platelet - VITAMIN D 25 Hydroxy (Vit-D Deficiency, Fractures)  2. Hyperlipidemia LDL goal <70 -Continue with aggressive therapeutic lifestyle changes and current dose of Crestor pending results of lab work - CBC with Differential/Platelet - Lipid panel  3. Essential hypertension -Blood pressure is good today and he will continue with current  treatment - BMP8+EGFR - CBC with Differential/Platelet - Hepatic function panel  4. ASCVD (arteriosclerotic cardiovascular disease) -Follow-up with cardiology as planned and continue to follow aggressive therapeutic lifestyle changes and take statin drug - CBC with Differential/Platelet - Lipid panel  5. Benign prostatic hyperplasia, unspecified whether lower urinary tract symptoms present -No complaints today with voiding - CBC with Differential/Platelet  6. Smoker -The patient was encouraged again to reduce and stop his smoking.  He says he is cut this down but has not stopped it.  We talked about this for several minutes and hopefully he will make some changes  7. Ischemic cardiomyopathy -Continue to follow-up with cardiology  8. Alcoholic cardiomyopathy ([place]) -Continue to follow-up with cardiology and refrain from alcohol as he has been doing for over 12 years.  9. Simple chronic bronchitis ([place]) -Avoid irritating environments drink plenty of water and fluids and take Mucinex as needed for cough and congestion  Patient Instructions                       Medicare Annual Wellness  Visit  [facility] and the medical providers at [facility] strive to bring you the best medical care.  In doing so we not only want to address your current medical conditions and concerns but also to detect new conditions early and prevent illness, disease and health-related problems.    Medicare offers a yearly Wellness Visit which allows our clinical staff to assess your need for preventative services including immunizations, lifestyle education, counseling to decrease risk of preventable diseases and screening for fall risk and other medical concerns.    This visit is provided free of charge (no copay) for all Medicare recipients. The clinical pharmacists at [facility] have begun to conduct these Wellness Visits which will also include a thorough review of  all your medications.    As you primary medical provider recommend that you make an appointment for your Annual Wellness Visit if you have not done so already this year.  You may set up this appointment before you leave today or you may call back [phone]) and schedule an appointment.  Please make sure when you call that you mention that you are scheduling your Annual Wellness Visit with the clinical pharmacist so that the appointment may be made for the proper length of time.     Continue current medications. Continue good therapeutic lifestyle changes which include good diet and exercise. Fall precautions discussed with patient. If an FOBT was given today- please return it to our front desk. If you are over 45 years old - you may need Prevnar 56 or the adult Pneumonia vaccine.  **Flu shots are available--- please call and schedule a FLU-CLINIC appointment**  After your visit with [place] today you will receive a survey in the mail or online from [employer] regarding your care with [place]. Please take a moment to fill this out. Your feedback is very important to [place] as you can help [place] better understand your patient needs as well as improve your experience and satisfaction. WE CARE ABOUT YOU!!!   Continue to follow-up with cardiology as recommended Colonoscopy will be due and September 2021 Make every effort to stop smoking Drink plenty of water Watch diet closely Stay active physically  Arrie Senate MD

## 2017-10-04 LAB — BMP8+EGFR
BUN / CREAT RATIO: 7 — AB (ref 10–24)
BUN: 7 mg/dL — ABNORMAL LOW (ref 8–27)
CHLORIDE: 100 mmol/L (ref 96–106)
CO2: 26 mmol/L (ref 20–29)
Calcium: 9.1 mg/dL (ref 8.6–10.2)
Creatinine, Ser: 0.99 mg/dL (ref 0.76–1.27)
GFR calc non Af Amer: 78 mL/min/{1.73_m2} (ref >59)
GFR, EST AFRICAN AMERICAN: 90 mL/min/{1.73_m2} (ref >59)
GLUCOSE: 83 mg/dL (ref 65–99)
POTASSIUM: 4.5 mmol/L (ref 3.5–5.2)
SODIUM: 138 mmol/L (ref 134–144)

## 2017-10-04 LAB — CBC WITH DIFFERENTIAL/PLATELET
BASOS ABS: 0 10*3/uL (ref 0.0–0.2)
Basos: 1 %
EOS (ABSOLUTE): 0.2 10*3/uL (ref 0.0–0.4)
Eos: 3 %
HEMOGLOBIN: 15.9 g/dL (ref 13.0–17.7)
Hematocrit: 47 % (ref 37.5–51.0)
Immature Grans (Abs): 0 10*3/uL (ref 0.0–0.1)
Immature Granulocytes: 0 %
LYMPHS ABS: 1.9 10*3/uL (ref 0.7–3.1)
Lymphs: 32 %
MCH: 31.2 pg (ref 26.6–33.0)
MCHC: 33.8 g/dL (ref 31.5–35.7)
MCV: 92 fL (ref 79–97)
MONOCYTES: 8 %
MONOS ABS: 0.5 10*3/uL (ref 0.1–0.9)
NEUTROS ABS: 3.3 10*3/uL (ref 1.4–7.0)
Neutrophils: 56 %
Platelets: 180 10*3/uL (ref 150–450)
RBC: 5.09 x10E6/uL (ref 4.14–5.80)
RDW: 13.9 % (ref 12.3–15.4)
WBC: 5.9 10*3/uL (ref 3.4–10.8)

## 2017-10-04 LAB — LIPID PANEL
CHOLESTEROL TOTAL: 123 mg/dL (ref 100–199)
Chol/HDL Ratio: 2.7 ratio (ref 0.0–5.0)
HDL: 46 mg/dL (ref >39)
LDL CALC: 67 mg/dL (ref 0–99)
Triglycerides: 51 mg/dL (ref 0–149)
VLDL Cholesterol Cal: 10 mg/dL (ref 5–40)

## 2017-10-04 LAB — HEPATIC FUNCTION PANEL
ALBUMIN: 4.3 g/dL (ref 3.6–4.8)
ALK PHOS: 71 IU/L (ref 39–117)
ALT: 11 IU/L (ref 0–44)
AST: 14 IU/L (ref 0–40)
BILIRUBIN TOTAL: 0.3 mg/dL (ref 0.0–1.2)
Bilirubin, Direct: 0.1 mg/dL (ref 0.00–0.40)
Total Protein: 6.4 g/dL (ref 6.0–8.5)

## 2017-10-04 LAB — VITAMIN D 25 HYDROXY (VIT D DEFICIENCY, FRACTURES): Vit D, 25-Hydroxy: 59.5 ng/mL (ref 30.0–100.0)

## 2017-10-17 ENCOUNTER — Other Ambulatory Visit: Payer: Self-pay | Admitting: Family Medicine

## 2017-11-21 ENCOUNTER — Other Ambulatory Visit: Payer: Self-pay | Admitting: Cardiology

## 2017-11-21 NOTE — Telephone Encounter (Signed)
Rx sent to pharmacy   

## 2017-12-01 ENCOUNTER — Other Ambulatory Visit: Payer: Self-pay | Admitting: Cardiology

## 2017-12-08 ENCOUNTER — Other Ambulatory Visit: Payer: Self-pay | Admitting: Cardiology

## 2017-12-13 ENCOUNTER — Other Ambulatory Visit: Payer: Self-pay | Admitting: Family Medicine

## 2018-02-12 NOTE — Progress Notes (Signed)
HPI The patient presents for follow up of CAD.  Since I last saw him he has done well.  We talked about his submaximal test and the reason of the submaximal was because he got anxious because he is not used to walking up an incline.  He is not doing as much exercise as I would like.  However, with activity such as walking he denies any chest pressure, neck or arm discomfort.  Is not having any palpitations, presyncope or syncope.  Is not having any PND or orthopnea.  He still smokes cigarettes.  He works 2 to 3 hours a day and he enjoys this and has little to no stress.   Allergies  Allergen Reactions  . Penicillins     REACTION: Reaction not known    Current Outpatient Medications  Medication Sig Dispense Refill  . aspirin 81 MG tablet Take 81 mg by mouth daily.      . carvedilol (COREG) 3.125 MG tablet TAKE 1 TABLET (3.125 MG TOTAL) BY MOUTH 2 (TWO) TIMES DAILY. 180 tablet 0  . Cholecalciferol (VITAMIN D3) 2000 UNITS TABS Take 1 tablet by mouth daily.      . clopidogrel (PLAVIX) 75 MG tablet TAKE 1 TABLET EVERY DAY 90 tablet 2  . lisinopril (PRINIVIL,ZESTRIL) 2.5 MG tablet TAKE 1 TABLET BY MOUTH EVERY DAY 90 tablet 0  . lisinopril (PRINIVIL,ZESTRIL) 5 MG tablet TAKE 1 TABLET DAILY. TAKE ALONG WITH 2.5 MG TABLET TO MAKE 7.5 MG. 90 tablet 1  . nitroGLYCERIN (NITROSTAT) 0.4 MG SL tablet Place 1 tablet (0.4 mg total) under the tongue every 5 (five) minutes as needed. 25 tablet 1  . rosuvastatin (CRESTOR) 10 MG tablet TAKE 1 TABLET (10 MG TOTAL) BY MOUTH DAILY. 90 tablet 1   No current facility-administered medications for this visit.     Past Medical History:  Diagnosis Date  . Coronary artery disease    s/p DES to LAD, occluded RCA  . H/O: alcohol abuse   . Hyperlipidemia   . Ischemic cardiomyopathy   . Post-infarction apical thrombus [facility])     Past Surgical History:  Procedure Laterality Date  . CORONARY STENT PLACEMENT N/A 08/2008  . None      ROS:    As stated in the  HPI and negative for all other systems.     PHYSICAL EXAM BP 130/70   Pulse 62   Ht 5\' 9"  (1.753 m)   Wt 156 lb (70.8 kg)   BMI 23.04 kg/m   GENERAL:  Well appearing NECK:  No jugular venous distention, waveform within normal limits, carotid upstroke brisk and symmetric, no bruits, no thyromegaly LUNGS:  Clear to auscultation bilaterally CHEST:  Unremarkable HEART:  PMI not displaced or sustained,S1 and S2 within normal limits, no S3, no S4, no clicks, no rubs, no murmurs ABD:  Flat, positive bowel sounds normal in frequency in pitch, no bruits, no rebound, no guarding, no midline pulsatile mass, no hepatomegaly, no splenomegaly EXT:  2 plus pulses throughout, no edema, no cyanosis no clubbing    Lab Results  Component Value Date   CHOL 123 10/03/2017   TRIG 51 10/03/2017   HDL 46 10/03/2017   LDLCALC 67 10/03/2017    EKG:  Sinus bradycardia, rate 62 old inferior infarct, lateral T-wave inversions unchanged from previous.  Mild QRS widening in a nonspecific pattern unchanged from previous.  PVCs  02/13/2018  ASSESSMENT AND PLAN  CAD:  The patient has no new sypmtoms.  No  further cardiovascular testing is indicated.  We will continue with aggressive risk reduction and meds as listed.  TOBACCO ABUSE:  He is unable to quit smoking.  No change in therapy.   ISCHEMIC CARDIOMYOPATHY:   His EF was mildly low.  However, he has no symptoms.  No change in therapy.    DYSLIPIDEMIA:  He had an LDL was as above.  Continue current therapy.

## 2018-02-13 ENCOUNTER — Ambulatory Visit (INDEPENDENT_AMBULATORY_CARE_PROVIDER_SITE_OTHER): Payer: Medicare Other | Admitting: Cardiology

## 2018-02-13 ENCOUNTER — Encounter: Payer: Self-pay | Admitting: Cardiology

## 2018-02-13 VITALS — BP 130/70 | HR 62 | Ht 69.0 in | Wt 156.0 lb

## 2018-02-13 DIAGNOSIS — Z720 Tobacco use: Secondary | ICD-10-CM | POA: Diagnosis not present

## 2018-02-13 DIAGNOSIS — E785 Hyperlipidemia, unspecified: Secondary | ICD-10-CM | POA: Diagnosis not present

## 2018-02-13 DIAGNOSIS — I255 Ischemic cardiomyopathy: Secondary | ICD-10-CM

## 2018-02-13 DIAGNOSIS — I2510 Atherosclerotic heart disease of native coronary artery without angina pectoris: Secondary | ICD-10-CM | POA: Diagnosis not present

## 2018-02-13 NOTE — Patient Instructions (Addendum)
Medication Instructions:  The current medical regimen is effective;  continue present plan and medications.  If you need a refill on your cardiac medications before your next appointment, please call your pharmacy.   Follow-Up: Follow up in 1 year with Dr. Hochrein in [place].  You will receive a letter in the mail 2 months before you are due.  Please call us when you receive this letter to schedule your follow up appointment.  Thank you for choosing [facility]!!     

## 2018-02-17 ENCOUNTER — Other Ambulatory Visit: Payer: Self-pay | Admitting: Cardiology

## 2018-02-26 ENCOUNTER — Other Ambulatory Visit: Payer: Self-pay | Admitting: Cardiology

## 2018-02-27 NOTE — Telephone Encounter (Signed)
Rx request sent to pharmacy.  

## 2018-03-07 ENCOUNTER — Other Ambulatory Visit: Payer: Self-pay | Admitting: Family Medicine

## 2018-03-22 ENCOUNTER — Other Ambulatory Visit: Payer: Self-pay | Admitting: Cardiology

## 2018-04-08 ENCOUNTER — Other Ambulatory Visit: Payer: Self-pay | Admitting: Family Medicine

## 2018-04-10 ENCOUNTER — Ambulatory Visit: Payer: Medicare Other | Admitting: Family Medicine

## 2018-04-10 ENCOUNTER — Ambulatory Visit (INDEPENDENT_AMBULATORY_CARE_PROVIDER_SITE_OTHER): Payer: Medicare Other | Admitting: *Deleted

## 2018-04-10 DIAGNOSIS — Z23 Encounter for immunization: Secondary | ICD-10-CM | POA: Diagnosis not present

## 2018-05-16 ENCOUNTER — Other Ambulatory Visit: Payer: Self-pay | Admitting: Cardiology

## 2018-05-16 NOTE — Telephone Encounter (Signed)
Rx request sent to pharmacy.  

## 2018-05-24 ENCOUNTER — Ambulatory Visit (INDEPENDENT_AMBULATORY_CARE_PROVIDER_SITE_OTHER): Payer: Medicare Other

## 2018-05-24 ENCOUNTER — Encounter: Payer: Self-pay | Admitting: Family Medicine

## 2018-05-24 ENCOUNTER — Ambulatory Visit: Payer: Medicare Other | Admitting: Family Medicine

## 2018-05-24 VITALS — BP 142/81 | HR 55 | Temp 97.2°F | Ht 69.0 in | Wt 160.0 lb

## 2018-05-24 DIAGNOSIS — F17200 Nicotine dependence, unspecified, uncomplicated: Secondary | ICD-10-CM

## 2018-05-24 DIAGNOSIS — I426 Alcoholic cardiomyopathy: Secondary | ICD-10-CM

## 2018-05-24 DIAGNOSIS — I2510 Atherosclerotic heart disease of native coronary artery without angina pectoris: Secondary | ICD-10-CM

## 2018-05-24 DIAGNOSIS — I10 Essential (primary) hypertension: Secondary | ICD-10-CM | POA: Diagnosis not present

## 2018-05-24 DIAGNOSIS — E785 Hyperlipidemia, unspecified: Secondary | ICD-10-CM | POA: Diagnosis not present

## 2018-05-24 DIAGNOSIS — N400 Enlarged prostate without lower urinary tract symptoms: Secondary | ICD-10-CM

## 2018-05-24 DIAGNOSIS — L989 Disorder of the skin and subcutaneous tissue, unspecified: Secondary | ICD-10-CM

## 2018-05-24 DIAGNOSIS — E559 Vitamin D deficiency, unspecified: Secondary | ICD-10-CM

## 2018-05-24 DIAGNOSIS — R918 Other nonspecific abnormal finding of lung field: Secondary | ICD-10-CM | POA: Diagnosis not present

## 2018-05-24 LAB — URINALYSIS, COMPLETE
BILIRUBIN UA: NEGATIVE
Glucose, UA: NEGATIVE
Ketones, UA: NEGATIVE
Leukocytes, UA: NEGATIVE
Nitrite, UA: NEGATIVE
PH UA: 8 — AB (ref 5.0–7.5)
Protein, UA: NEGATIVE
RBC UA: NEGATIVE
Specific Gravity, UA: 1.015 (ref 1.005–1.030)
Urobilinogen, Ur: 2 mg/dL — ABNORMAL HIGH (ref 0.2–1.0)

## 2018-05-24 LAB — MICROSCOPIC EXAMINATION
Bacteria, UA: NONE SEEN
Epithelial Cells (non renal): NONE SEEN /hpf (ref 0–10)
RBC, UA: NONE SEEN /hpf (ref 0–2)
Renal Epithel, UA: NONE SEEN /hpf
WBC, UA: NONE SEEN /hpf (ref 0–5)

## 2018-05-24 NOTE — Patient Instructions (Addendum)
Medicare Annual Wellness Visit  [place] and the medical providers at [facility] strive to bring you the best medical care.  In doing so we not only want to address your current medical conditions and concerns but also to detect new conditions early and prevent illness, disease and health-related problems.    Medicare offers a yearly Wellness Visit which allows our clinical staff to assess your need for preventative services including immunizations, lifestyle education, counseling to decrease risk of preventable diseases and screening for fall risk and other medical concerns.    This visit is provided free of charge (no copay) for all Medicare recipients. The clinical pharmacists at [facility] have begun to conduct these Wellness Visits which will also include a thorough review of all your medications.    As you primary medical provider recommend that you make an appointment for your Annual Wellness Visit if you have not done so already this year.  You may set up this appointment before you leave today or you may call back ([phone]) and schedule an appointment.  Please make sure when you call that you mention that you are scheduling your Annual Wellness Visit with the clinical pharmacist so that the appointment may be made for the proper length of time.     Continue current medications. Continue good therapeutic lifestyle changes which include good diet and exercise. Fall precautions discussed with patient. If an FOBT was given today- please return it to our front desk. If you are over 58 years old - you may need Prevnar 13 or the adult Pneumonia vaccine.  **Flu shots are available--- please call and schedule a FLU-CLINIC appointment**  After your visit with [place] today you will receive a survey in the mail or online from [employer] regarding your care with [place]. Please take a moment to fill this out. Your feedback is very  important to [place] as you can help [place] better understand your patient needs as well as improve your experience and satisfaction. WE CARE ABOUT YOU!!!   Make every effort possible to stop smoking Continue to follow-up with Dr. Antoine Poche the cardiologist on a yearly basis in October We will schedule you for a visit with the dermatology group in [place] to look at the wart on your foot and treat this.  This is a good opportunity to set up annual visits for dermatology screening. Continue to drink plenty of water and exercise as much as possible We will call your lab work results as soon as they are returned and make any adjustments with your medicines at that time if needed

## 2018-05-24 NOTE — Progress Notes (Signed)
Subjective:    Patient ID: John Leon, male    DOB: [birth year], 68 y.o.   MRN: [id number]  HPI Pt here for follow up and management of chronic medical problems which includes hypertension and hyperlipidemia. He is taking medication regularly.  Patient is here today for regular follow-up and physical exam.  He continues to smoke.  He continues to be followed by the cardiologist on a regular basis.  He will get a chest x-ray today and will return to the office for fasting lab work.  Vital signs are stable but blood pressure is borderline for the systolic reading of 384.  Patient has coronary atherosclerosis and ischemic cardiomyopathy.  He last saw the cardiologist in October of this past year and will see him yearly.  He is taking vitamin D Crestor and Coreg.  He also takes lisinopril and a baby aspirin along with Plavix.  The patient is pleasant and doing well.  His initial blood pressure and repeat blood pressure today were on the borderline side and patient says his blood pressures usually run well when he is not stressed.  He will drop by here in a couple weeks and at the nurse recheck his blood pressure.  Today he denies any chest pain pressure tightness palpitations or shortness of breath.  He denies any trouble with swallowing heartburn indigestion nausea vomiting diarrhea or blood in the stool.  He is due another colonoscopy in 2021 in September.  He will call back if they do not call him and we will arrange for him to have a colonoscopy at that time.  There is no family history of colon cancer.  He denies any trouble passing his water including burning pain or frequency.  He does have what appears to be a plantar wart on the bottom of the left foot which is very painful and we will recommend he go see the dermatologist for treatment of this.  He is still smoking a pack a day and trying to cut back and not willing to stop it.    Patient Active Problem List   Diagnosis Date Noted  .  Coronary artery disease involving native coronary artery of native heart without angina pectoris 01/18/2017  . Ischemic cardiomyopathy 01/18/2017  . ASCVD (arteriosclerotic cardiovascular disease) 12/11/2014  . BPH (benign prostatic hyperplasia) 12/11/2014  . Vitamin D deficiency 12/11/2014  . Hypertension 10/27/2013  . History of gastroesophageal reflux (GERD) 10/27/2013  . Nicotine abuse 10/27/2013  . TOBACCO ABUSE 10/08/2008  . Coronary atherosclerosis 10/08/2008  . Alcoholic cardiomyopathy ([place]) 10/08/2008  . MURAL THROMBUS 10/08/2008  . HYPERCHOLESTEROLEMIA-PURE 09/22/2008  . CARDIOVASCULAR FUNCTION STUDY, ABNORMAL 09/22/2008   Outpatient Encounter Medications as of 05/24/2018  Medication Sig  . aspirin 81 MG tablet Take 81 mg by mouth daily.    . carvedilol (COREG) 3.125 MG tablet TAKE 1 TABLET (3.125 MG TOTAL) BY MOUTH 2 (TWO) TIMES DAILY.  Marland Kitchen Cholecalciferol (VITAMIN D3) 2000 UNITS TABS Take 1 tablet by mouth daily.    . clopidogrel (PLAVIX) 75 MG tablet TAKE 1 TABLET EVERY DAY  . lisinopril (PRINIVIL,ZESTRIL) 2.5 MG tablet TAKE 1 TABLET BY MOUTH EVERY DAY  . lisinopril (PRINIVIL,ZESTRIL) 5 MG tablet TAKE 1 TABLET DAILY. TAKE ALONG WITH 2.5 MG TABLET TO MAKE 7.5 MG.  . nitroGLYCERIN (NITROSTAT) 0.4 MG SL tablet PLACE 1 TABLET (0.4 MG TOTAL) UNDER THE TONGUE EVERY 5 (FIVE) MINUTES AS NEEDED.  Marland Kitchen rosuvastatin (CRESTOR) 10 MG tablet TAKE 1 TABLET (10 MG TOTAL) BY MOUTH DAILY.  No facility-administered encounter medications on file as of 05/24/2018.      Review of Systems  Constitutional: Negative.   HENT: Negative.   Eyes: Negative.   Respiratory: Negative.   Cardiovascular: Negative.   Gastrointestinal: Negative.   Endocrine: Negative.   Genitourinary: Negative.   Musculoskeletal: Negative.   Skin: Negative.        Lesion on bottom of foot -left   Allergic/Immunologic: Negative.   Neurological: Negative.   Hematological: Negative.   Psychiatric/Behavioral: Negative.         Objective:   Physical Exam Vitals signs and nursing note reviewed.  Constitutional:      Appearance: Normal appearance. He is well-developed and normal weight. He is not ill-appearing.     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     Right Ear: Tympanic membrane, ear canal and external ear normal. There is no impacted cerumen.     Left Ear: Tympanic membrane, ear canal and external ear normal. There is no impacted cerumen.     Nose: Nose normal. No congestion.     Mouth/Throat:     Mouth: Mucous membranes are moist.     Pharynx: Oropharynx is clear. No oropharyngeal exudate.  Eyes:     General: No scleral icterus.       Right eye: No discharge.        Left eye: No discharge.     Extraocular Movements: Extraocular movements intact.     Conjunctiva/sclera: Conjunctivae normal.     Pupils: Pupils are equal, round, and reactive to light.  Neck:     Musculoskeletal: Normal range of motion and neck supple.     Thyroid: No thyromegaly.     Vascular: No carotid bruit.     Trachea: No tracheal deviation.     Comments: No bruits thyromegaly or anterior cervical adenopathy Cardiovascular:     Rate and Rhythm: Normal rate and regular rhythm.     Pulses: Normal pulses.     Heart sounds: Normal heart sounds. No murmur. No gallop.      Comments: Heart is regular at 60/min good pedal pulses and no edema Pulmonary:     Effort: Pulmonary effort is normal. No respiratory distress.     Breath sounds: Wheezing present. No rales.     Comments: No axillary adenopathy chest masses.  Diminished breath sounds bilaterally and tight cough with coughing. Chest:     Chest wall: No tenderness.  Abdominal:     General: Abdomen is flat. Bowel sounds are normal.     Palpations: Abdomen is soft. There is no mass.     Tenderness: There is no abdominal tenderness. There is no guarding or rebound.     Comments: No  liver or spleen enlargement no epigastric tenderness masses inguinal adenopathy or bruits  Genitourinary:    Penis: Normal.      Scrotum/Testes: Normal.     Rectum: Normal.     Comments: The prostate is slightly enlarged without lumps or masses.  The rectal exam was negative for masses.  The external genitalia were within normal limits and no inguinal hernias were palpable. Musculoskeletal: Normal range of motion.        General: No tenderness.     Right lower leg: No edema.     Left lower leg: No edema.  Lymphadenopathy:     Cervical: No cervical adenopathy.  Skin:  General: Skin is warm and dry.     Coloration: Skin is not pale.     Findings: Lesion present. No erythema or rash.     Comments: Plantar wart on the surface of the left foot.  We will have dermatology see patient and possibly do injection to remove wart.  Neurological:     General: No focal deficit present.     Mental Status: He is alert and oriented to person, place, and time. Mental status is at baseline.     Cranial Nerves: No cranial nerve deficit.     Motor: No weakness.     Gait: Gait normal.     Deep Tendon Reflexes: Reflexes are normal and symmetric. Reflexes normal.  Psychiatric:        Mood and Affect: Mood normal.        Behavior: Behavior normal.        Thought Content: Thought content normal.        Judgment: Judgment normal.     Comments: Mood affect and behavior for this patient are normal.     BP 140/76 (BP Location: Left Arm)   Pulse (!) 55   Temp (!) 97.2 F (36.2 C) (Oral)   Ht _0  (1.753 m)   Wt 160 lb (72.6 kg)   BMI 23.63 kg/m        Assessment & Plan:  1. Vitamin D deficiency -Continue vitamin D replacement pending results of lab work - CBC with Differential/Platelet - VITAMIN D 25 Hydroxy (Vit-D Deficiency, Fractures)  2. Essential hypertension -The systolic blood pressure was elevated today on 2 readings.  The patient says normally it runs much better.  We will have him  come back by the office in a couple weeks for repeat blood pressure and make any changes if necessary at that time to his medicines.  He will continue to watch sodium intake. - CBC with Differential/Platelet - BMP8+EGFR - Hepatic function panel - DG Chest 2 View; Future  3. Hyperlipidemia LDL goal <70 -Continue with Crestor and therapeutic lifestyle changes pending results of lab work - CBC with Differential/Platelet - Lipid panel - DG Chest 2 View; Future  4. ASCVD (arteriosclerotic cardiovascular disease) -Follow-up with Dr. Percival Spanish yearly in October - CBC with Differential/Platelet - Lipid panel - DG Chest 2 View; Future  5. Benign prostatic hyperplasia, unspecified whether lower urinary tract symptoms present -Prostate remains slightly enlarged without lumps or masses and patient is having no symptoms with this. - CBC with Differential/Platelet - PSA, total and free - Urinalysis, Complete  6. Smoker -He is smoking about 1 pack/day and says that he does not smoke the whole cigarette.  Encouraged him to work on stopping completely and he says he knows he needs to but just is not ready to do that yet. - CBC with Differential/Platelet - DG Chest 2 View; Future  7. Lesion of skin of foot -This is most likely a plantar wart and we will do a referral to [facility] dermatology for thorough management and removal. - Ambulatory referral to Dermatology  8. Alcoholic cardiomyopathy [facility]) -Follow-up with cardiology regularly and yearly  Patient Instructions                       Medicare Annual Wellness Visit  [facility] and the medical providers at [facility] strive to bring you the best medical care.  In doing so we not only want to address your current medical conditions and  concerns but also to detect new conditions early and prevent illness, disease and health-related problems.    Medicare offers a yearly Wellness Visit which allows our clinical staff to  assess your need for preventative services including immunizations, lifestyle education, counseling to decrease risk of preventable diseases and screening for fall risk and other medical concerns.    This visit is provided free of charge (no copay) for all Medicare recipients. The clinical pharmacists at [facility] have begun to conduct these Wellness Visits which will also include a thorough review of all your medications.    As you primary medical provider recommend that you make an appointment for your Annual Wellness Visit if you have not done so already this year.  You may set up this appointment before you leave today or you may call back ([phone]) and schedule an appointment.  Please make sure when you call that you mention that you are scheduling your Annual Wellness Visit with the clinical pharmacist so that the appointment may be made for the proper length of time.     Continue current medications. Continue good therapeutic lifestyle changes which include good diet and exercise. Fall precautions discussed with patient. If an FOBT was given today- please return it to our front desk. If you are over 30 years old - you may need Prevnar 49 or the adult Pneumonia vaccine.  **Flu shots are available--- please call and schedule a FLU-CLINIC appointment**  After your visit with [place] today you will receive a survey in the mail or online from [employer] regarding your care with [place]. Please take a moment to fill this out. Your feedback is very important to [place] as you can help [place] better understand your patient needs as well as improve your experience and satisfaction. WE CARE ABOUT YOU!!!   Make every effort possible to stop smoking Continue to follow-up with Dr. Percival Spanish the cardiologist on a yearly basis in October We will schedule you for a visit with the dermatology group in [place] to look at the wart on your foot and treat this.  This is a good opportunity to set up  annual visits for dermatology screening. Continue to drink plenty of water and exercise as much as possible We will call your lab work results as soon as they are returned and make any adjustments with your medicines at that time if needed  Arrie Senate MD

## 2018-05-28 ENCOUNTER — Other Ambulatory Visit: Payer: Medicare Other

## 2018-05-28 DIAGNOSIS — E559 Vitamin D deficiency, unspecified: Secondary | ICD-10-CM | POA: Diagnosis not present

## 2018-05-28 DIAGNOSIS — I2510 Atherosclerotic heart disease of native coronary artery without angina pectoris: Secondary | ICD-10-CM | POA: Diagnosis not present

## 2018-05-28 DIAGNOSIS — E785 Hyperlipidemia, unspecified: Secondary | ICD-10-CM | POA: Diagnosis not present

## 2018-05-28 DIAGNOSIS — I10 Essential (primary) hypertension: Secondary | ICD-10-CM | POA: Diagnosis not present

## 2018-05-29 LAB — CBC WITH DIFFERENTIAL/PLATELET
Basophils Absolute: 0.1 10*3/uL (ref 0.0–0.2)
Basos: 1 %
EOS (ABSOLUTE): 0.2 10*3/uL (ref 0.0–0.4)
EOS: 3 %
HEMATOCRIT: 46.5 % (ref 37.5–51.0)
HEMOGLOBIN: 16 g/dL (ref 13.0–17.7)
IMMATURE GRANS (ABS): 0 10*3/uL (ref 0.0–0.1)
IMMATURE GRANULOCYTES: 0 %
LYMPHS ABS: 2.3 10*3/uL (ref 0.7–3.1)
LYMPHS: 34 %
MCH: 32.5 pg (ref 26.6–33.0)
MCHC: 34.4 g/dL (ref 31.5–35.7)
MCV: 94 fL (ref 79–97)
MONOCYTES: 8 %
Monocytes Absolute: 0.5 10*3/uL (ref 0.1–0.9)
NEUTROS PCT: 54 %
Neutrophils Absolute: 3.7 10*3/uL (ref 1.4–7.0)
Platelets: 190 10*3/uL (ref 150–450)
RBC: 4.93 x10E6/uL (ref 4.14–5.80)
RDW: 12.1 % (ref 11.6–15.4)
WBC: 6.8 10*3/uL (ref 3.4–10.8)

## 2018-05-29 LAB — LIPID PANEL
Chol/HDL Ratio: 2.9 ratio (ref 0.0–5.0)
Cholesterol, Total: 116 mg/dL (ref 100–199)
HDL: 40 mg/dL (ref >39)
LDL CALC: 63 mg/dL (ref 0–99)
Triglycerides: 67 mg/dL (ref 0–149)
VLDL Cholesterol Cal: 13 mg/dL (ref 5–40)

## 2018-05-29 LAB — PSA, TOTAL AND FREE
PROSTATE SPECIFIC AG, SERUM: 1.1 ng/mL (ref 0.0–4.0)
PSA, Free Pct: 18.2 %
PSA, Free: 0.2 ng/mL

## 2018-05-29 LAB — BMP8+EGFR
BUN/Creatinine Ratio: 10 (ref 10–24)
BUN: 10 mg/dL (ref 8–27)
CALCIUM: 9 mg/dL (ref 8.6–10.2)
CO2: 24 mmol/L (ref 20–29)
CREATININE: 0.96 mg/dL (ref 0.76–1.27)
Chloride: 99 mmol/L (ref 96–106)
GFR calc Af Amer: 94 mL/min/{1.73_m2} (ref >59)
GFR, EST NON AFRICAN AMERICAN: 81 mL/min/{1.73_m2} (ref >59)
Glucose: 83 mg/dL (ref 65–99)
Potassium: 4.1 mmol/L (ref 3.5–5.2)
Sodium: 139 mmol/L (ref 134–144)

## 2018-05-29 LAB — HEPATIC FUNCTION PANEL
ALT: 13 IU/L (ref 0–44)
AST: 15 IU/L (ref 0–40)
Albumin: 4.6 g/dL (ref 3.8–4.8)
Alkaline Phosphatase: 71 IU/L (ref 39–117)
Bilirubin Total: 0.3 mg/dL (ref 0.0–1.2)
Bilirubin, Direct: 0.1 mg/dL (ref 0.00–0.40)
Total Protein: 6.3 g/dL (ref 6.0–8.5)

## 2018-05-29 LAB — VITAMIN D 25 HYDROXY (VIT D DEFICIENCY, FRACTURES): Vit D, 25-Hydroxy: 48.6 ng/mL (ref 30.0–100.0)

## 2018-06-04 ENCOUNTER — Other Ambulatory Visit: Payer: Self-pay | Admitting: Cardiology

## 2018-06-13 ENCOUNTER — Ambulatory Visit: Payer: Medicare Other | Admitting: *Deleted

## 2018-06-13 DIAGNOSIS — Z0130 Encounter for examination of blood pressure without abnormal findings: Secondary | ICD-10-CM

## 2018-06-13 NOTE — Progress Notes (Signed)
Pt here for BP reading BP 126 74 P 60

## 2018-07-01 ENCOUNTER — Other Ambulatory Visit: Payer: Self-pay | Admitting: Family Medicine

## 2018-08-11 ENCOUNTER — Other Ambulatory Visit: Payer: Self-pay | Admitting: Cardiology

## 2018-08-23 ENCOUNTER — Other Ambulatory Visit: Payer: Self-pay | Admitting: Cardiology

## 2018-08-23 NOTE — Telephone Encounter (Signed)
Clopidogrel refilled

## 2018-09-02 ENCOUNTER — Telehealth: Payer: Self-pay | Admitting: Family Medicine

## 2018-09-14 ENCOUNTER — Other Ambulatory Visit: Payer: Self-pay | Admitting: Family Medicine

## 2018-09-27 ENCOUNTER — Ambulatory Visit (INDEPENDENT_AMBULATORY_CARE_PROVIDER_SITE_OTHER): Payer: Medicare Other | Admitting: *Deleted

## 2018-09-27 ENCOUNTER — Other Ambulatory Visit: Payer: Self-pay

## 2018-09-27 VITALS — BP 142/81 | HR 55 | Ht 69.0 in | Wt 160.0 lb

## 2018-09-27 DIAGNOSIS — Z0000 Encounter for general adult medical examination without abnormal findings: Secondary | ICD-10-CM

## 2018-09-27 NOTE — Patient Instructions (Signed)
  Mr. John Leon , Thank you for taking time to come for your Medicare Wellness Visit. I appreciate your ongoing commitment to your health goals. Please review the following plan we discussed and let me know if I can assist you in the future.   These are the goals we discussed: Goals    . Increase physical activity     Stay sober  Try not to fall Keep stress down.        This is a list of the screening recommended for you and due dates:  Health Maintenance  Topic Date Due  . Stool Blood Test  08/14/2011  . Flu Shot  11/30/2018  . Colon Cancer Screening  12/31/2019  . Tetanus Vaccine  10/28/2023  .  Hepatitis C: One time screening is recommended by Center for Disease Control  (CDC) for  adults born from [birth year] through 1966.   Completed  . Pneumonia vaccines  Completed

## 2018-09-27 NOTE — Progress Notes (Signed)
MEDICARE ANNUAL WELLNESS VISIT  09/27/2018  Telephone Visit Disclaimer This Medicare AWV was conducted by telephone due to national recommendations for restrictions regarding the COVID-19 Pandemic (e.g. social distancing).  I verified, using two identifiers, that I am speaking with John Leon or their authorized healthcare agent. I discussed the limitations, risks, security, and privacy concerns of performing an evaluation and management service by telephone and the potential availability of an in-person appointment in the future. The patient expressed understanding and agreed to proceed.   Subjective:  John Leon is a 68 y.o. male patient of Ernestina Penna, MD who had a Medicare Annual Wellness Visit today via telephone. John Leon is Working part time and lives alone. he has 3 children. he reports that he is socially active and does not interact with friends/family regularly. he is minimally physically active and enjoys music reading and sponsoring others in [place].  Patient Care Team: Ernestina Penna, MD as PCP - General (Family Medicine)  Advanced Directives 09/27/2018  Does Patient Have a Medical Advance Directive? No  Would patient like information on creating a medical advance directive? No - Patient declined    Hospital Utilization Over the Past 12 Months: # of hospitalizations or ER visits: 0 # of surgeries: 0  Review of Systems    Patient reports that his overall health is unchanged compared to last year.  Patient Reported Readings (BP, Pulse, CBG, Weight, etc) BP (!) 142/81   Pulse (!) 55   Ht  (1.753 m)   Wt 160 lb (72.6 kg)   BMI 23.63 kg/m    Review of Systems: General ROS: negative  All other systems negative.  Pain Assessment       Current Medications & Allergies (verified) Allergies as of 09/27/2018      Reactions   Penicillins    REACTION: Reaction not known      Medication List       Accurate as of Sep 27, 2018  9:41 AM. If you  have any questions, ask your nurse or doctor.        aspirin 81 MG tablet Take 81 mg by mouth daily.   carvedilol 3.125 MG tablet Commonly known as:  COREG TAKE 1 TABLET (3.125 MG TOTAL) BY MOUTH 2 (TWO) TIMES DAILY.   clopidogrel 75 MG tablet Commonly known as:  PLAVIX TAKE 1 TABLET EVERY DAY   lisinopril 5 MG tablet Commonly known as:  ZESTRIL TAKE 1 TABLET DAILY. TAKE ALONG WITH 2.5 MG TABLET TO MAKE 7.5 MG.   lisinopril 2.5 MG tablet Commonly known as:  ZESTRIL TAKE 1 TABLET BY MOUTH EVERY DAY   nitroGLYCERIN 0.4 MG SL tablet Commonly known as:  NITROSTAT PLACE 1 TABLET (0.4 MG TOTAL) UNDER THE TONGUE EVERY 5 (FIVE) MINUTES AS NEEDED.   rosuvastatin 10 MG tablet Commonly known as:  CRESTOR TAKE 1 TABLET (10 MG TOTAL) BY MOUTH DAILY.   Vitamin D3 50 MCG (2000 UT) Tabs Take 1 tablet by mouth daily.       History (reviewed): Past Medical History:  Diagnosis Date  . Coronary artery disease    s/p DES to LAD, occluded RCA  . H/O: alcohol abuse   . Hyperlipidemia   . Hypertension   . Ischemic cardiomyopathy   . Post-infarction apical thrombus [facility])    Past Surgical History:  Procedure Laterality Date  . CORONARY STENT PLACEMENT N/A 08/2008  . None     Family History  Problem Relation Age  of Onset  . Heart attack Father 68  . Aneurysm Father 68       brain  . CAD Father   . Dementia Mother   . Arthritis Maternal Grandmother   . Heart attack Maternal Grandfather   . Lung disease Brother        agent orange  . Healthy Daughter   . Healthy Son   . Healthy Son    Social History   Socioeconomic History  . Marital status: Divorced    Spouse name: Not on file  . Number of children: 3  . Years of education: Not on file  . Highest education level: Not on file  Occupational History  . Occupation: retired     Comment: Architectural technologist  . Financial resource strain: Not on file  . Food insecurity:    Worry: Not on file    Inability: Not on  file  . Transportation needs:    Medical: Not on file    Non-medical: Not on file  Tobacco Use  . Smoking status: Current Every Day Smoker    Packs/day: 1.00    Types: Cigarettes    Start date: 06/22/1971  . Smokeless tobacco: Never Used  Substance and Sexual Activity  . Alcohol use: No  . Drug use: No  . Sexual activity: Not on file  Lifestyle  . Physical activity:    Days per week: Not on file    Minutes per session: Not on file  . Stress: Not on file  Relationships  . Social connections:    Talks on phone: Not on file    Gets together: Not on file    Attends religious service: Not on file    Active member of club or organization: Not on file    Attends meetings of clubs or organizations: Not on file    Relationship status: Not on file  Other Topics Concern  . Not on file  Social History Narrative  . Not on file    Activities of Daily Living In your present state of health, do you have any difficulty performing the following activities: 09/27/2018  Hearing? N  Vision? N  Difficulty concentrating or making decisions? N  Walking or climbing stairs? N  Dressing or bathing? N  Doing errands, shopping? N  Preparing Food and eating ? N  Using the Toilet? N  In the past six months, have you accidently leaked urine? N  Do you have problems with loss of bowel control? N  Managing your Medications? N  Managing your Finances? N  Housekeeping or managing your Housekeeping? N  Some recent data might be hidden    Patient Literacy    Exercise Current Exercise Habits: The patient does not participate in regular exercise at present, Time (Minutes): 15, Exercise limited by: None identified  Diet Patient reports consuming 2 meals a day and 1 snack(s) a day Patient reports that his primary diet is: Regular, healthy, he states he is a "label reader" and drinks a lot of water. Patient reports that she does have regular access to food.   Depression Screen PHQ 2/9 Scores  09/27/2018 05/24/2018 10/03/2017 04/04/2017 10/03/2016 01/24/2016 06/15/2015  PHQ - 2 Score 0 0 0 0 0 0 0     Fall Risk Fall Risk  09/27/2018 05/24/2018 10/03/2017 04/04/2017 10/03/2016  Falls in the past year? 0 0 No No No     Objective:  John Leon seemed alert and oriented and he participated appropriately during  our telephone visit.  Blood Pressure Weight BMI  BP Readings from Last 3 Encounters:  05/24/18 (!) 142/81  02/13/18 130/70  10/03/17 117/71   Wt Readings from Last 3 Encounters:  05/24/18 160 lb (72.6 kg)  02/13/18 156 lb (70.8 kg)  10/03/17 154 lb (69.9 kg)   BMI Readings from Last 1 Encounters:  05/24/18 23.63 kg/m    *Unable to obtain current vital signs, weight, and BMI due to telephone visit type  Hearing/Vision  . Molly MaduroRobert did not seem to have difficulty with hearing/understanding during the telephone conversation . Reports that he has had a formal eye exam by an eye care professional within the past year . Reports that he has not had a formal hearing evaluation within the past year *Unable to fully assess hearing and vision during telephone visit type  Cognitive Function: 6CIT Screen 09/27/2018  What Year? 0 points  What month? 0 points  What time? 0 points  Count back from 20 0 points  Months in reverse 0 points  Repeat phrase 0 points  Total Score 0    Normal Cognitive Function Screening: Yes (Normal:0-7, Significant for Dysfunction: >8)  Immunization & Health Maintenance Record Immunization History  Administered Date(s) Administered  . Influenza Split 02/01/2013  . Influenza Whole 12/30/2009  . Influenza, High Dose Seasonal PF 04/04/2017, 04/10/2018  . Influenza,inj,Quad PF,6+ Mos 03/10/2015, 01/24/2016  . Pneumococcal Conjugate-13 12/11/2014  . Pneumococcal Polysaccharide-23 01/24/2016  . Td 07/30/2001  . Tdap 10/27/2013    Health Maintenance  Topic Date Due  . COLON CANCER SCREENING ANNUAL FOBT  08/14/2011  . INFLUENZA VACCINE  11/30/2018  .  COLONOSCOPY  12/31/2019  . TETANUS/TDAP  10/28/2023  . Hepatitis C Screening  Completed  . PNA vac Low Risk Adult  Completed       Assessment  This is a routine wellness examination for John Boyerobert C Dorvil.  Health Maintenance: Due or Overdue Health Maintenance Due  Topic Date Due  . COLON CANCER SCREENING ANNUAL FOBT  08/14/2011    John Boyerobert C Mella does not need a referral for Community Assistance: Care Management:   no Social Work:    no Prescription Assistance:  no Nutrition/Diabetes Education:  no   Plan:  Personalized Goals Goals Addressed            This Visit's Progress   . Increase physical activity       Stay sober  Try not to fall Keep stress down.       Personalized Health Maintenance & Screening Recommendations  fobt due at next OV  Lung Cancer Screening Recommended: no (Low Dose CT Chest recommended if Age 44-80 years, 30 pack-year currently smoking OR have quit w/in past 15 years) Hepatitis C Screening recommended: no HIV Screening recommended: no  Advanced Directives: Written information was not prepared per patient's request.  Referrals & Orders No orders of the defined types were placed in this encounter.   Follow-up Plan . Follow-up with Ernestina PennaMoore, Donald W, MD as planned    I have personally reviewed and noted the following in the patient's chart:   . Medical and social history . Use of alcohol, tobacco or illicit drugs  . Current medications and supplements . Functional ability and status . Nutritional status . Physical activity . Advanced directives . List of other physicians . Hospitalizations, surgeries, and ER visits in previous 12 months . Vitals . Screenings to include cognitive, depression, and falls . Referrals and appointments  In addition, I have reviewed and  discussed with John Leon certain preventive protocols, quality metrics, and best practice recommendations. A written personalized care plan for preventive  services as well as general preventive health recommendations is available and can be mailed to the patient at his request.      Leilani Merl  09/27/2018

## 2018-10-29 DIAGNOSIS — B070 Plantar wart: Secondary | ICD-10-CM | POA: Diagnosis not present

## 2018-11-22 ENCOUNTER — Ambulatory Visit: Payer: Medicare Other | Admitting: Family Medicine

## 2018-11-26 DIAGNOSIS — B070 Plantar wart: Secondary | ICD-10-CM | POA: Diagnosis not present

## 2018-12-09 ENCOUNTER — Other Ambulatory Visit: Payer: Self-pay | Admitting: Family Medicine

## 2018-12-24 ENCOUNTER — Other Ambulatory Visit: Payer: Self-pay | Admitting: Family Medicine

## 2019-01-01 ENCOUNTER — Other Ambulatory Visit: Payer: Self-pay | Admitting: Physician Assistant

## 2019-01-01 NOTE — Telephone Encounter (Signed)
Former [facility]. 30 days given 12/09/18 NTBS w/ new PCP

## 2019-01-02 ENCOUNTER — Other Ambulatory Visit: Payer: Self-pay | Admitting: *Deleted

## 2019-01-02 MED ORDER — CARVEDILOL 3.125 MG PO TABS: 3.1250 mg | ORAL_TABLET | Freq: Two times a day (BID) | ORAL | 0 refills | Status: DC

## 2019-01-02 NOTE — Telephone Encounter (Signed)
LM to call for appt 9/3 -jhb

## 2019-01-18 ENCOUNTER — Other Ambulatory Visit: Payer: Self-pay | Admitting: Nurse Practitioner

## 2019-01-21 ENCOUNTER — Other Ambulatory Visit: Payer: Self-pay

## 2019-01-22 ENCOUNTER — Encounter: Payer: Self-pay | Admitting: Family Medicine

## 2019-01-22 ENCOUNTER — Other Ambulatory Visit: Payer: Self-pay

## 2019-01-22 ENCOUNTER — Ambulatory Visit: Payer: Medicare Other | Admitting: Family Medicine

## 2019-01-22 VITALS — BP 147/79 | HR 54 | Temp 98.6°F | Resp 20 | Ht 69.0 in | Wt 153.0 lb

## 2019-01-22 DIAGNOSIS — E559 Vitamin D deficiency, unspecified: Secondary | ICD-10-CM

## 2019-01-22 DIAGNOSIS — I10 Essential (primary) hypertension: Secondary | ICD-10-CM | POA: Diagnosis not present

## 2019-01-22 DIAGNOSIS — F1021 Alcohol dependence, in remission: Secondary | ICD-10-CM

## 2019-01-22 DIAGNOSIS — I2510 Atherosclerotic heart disease of native coronary artery without angina pectoris: Secondary | ICD-10-CM

## 2019-01-22 DIAGNOSIS — F17200 Nicotine dependence, unspecified, uncomplicated: Secondary | ICD-10-CM

## 2019-01-22 DIAGNOSIS — Z23 Encounter for immunization: Secondary | ICD-10-CM

## 2019-01-22 DIAGNOSIS — E7800 Pure hypercholesterolemia, unspecified: Secondary | ICD-10-CM

## 2019-01-22 MED ORDER — LISINOPRIL 5 MG PO TABS: ORAL_TABLET | ORAL | 3 refills | Status: DC

## 2019-01-22 MED ORDER — ROSUVASTATIN CALCIUM 10 MG PO TABS: 10.0000 mg | ORAL_TABLET | Freq: Every day | ORAL | 1 refills | Status: DC

## 2019-01-22 MED ORDER — LISINOPRIL 2.5 MG PO TABS: 2.5000 mg | ORAL_TABLET | Freq: Every day | ORAL | 1 refills | Status: DC

## 2019-01-22 MED ORDER — CARVEDILOL 3.125 MG PO TABS: 3.1250 mg | ORAL_TABLET | Freq: Two times a day (BID) | ORAL | 1 refills | Status: DC

## 2019-01-22 NOTE — Progress Notes (Signed)
Subjective:  Patient ID: John Leon, male    DOB: Mar 12, 1951, 68 y.o.   MRN: [id number]  Patient Care Team: Baruch Gouty, FNP as PCP - General (Family Medicine)   Chief Complaint:  Medical Management of Chronic Issues (6 mo ), Hypertension, and Hyperlipidemia   HPI: John Leon is a 68 y.o. male presenting on 01/22/2019 for Medical Management of Chronic Issues (6 mo ), Hypertension, and Hyperlipidemia   1. Essential hypertension  Complaint with meds - Yes Current Medications - Coreg 3.125 mg, Lisinopril 7.5 mg Checking BP at home - No Exercising Regularly - No Watching Salt intake - Yes Pertinent ROS:  Headache - No Fatigue - No Visual Disturbances - No Chest pain - No Dyspnea - No Palpitations - No LE edema - No They report good compliance with medications and can restate their regimen by memory. No medication side effects.  Family, social, and smoking history reviewed.   BP Readings from Last 3 Encounters:  01/22/19 (!) 147/79  09/27/18 (!) 142/81  05/24/18 (!) 142/81   CMP Latest Ref Rng & Units 05/28/2018 10/03/2017 04/04/2017  Glucose 65 - 99 mg/dL 83 83 78  BUN 8 - 27 mg/dL 10 7(L) 8  Creatinine 0.76 - 1.27 mg/dL 0.96 0.99 0.88  Sodium 134 - 144 mmol/L 139 138 139  Potassium 3.5 - 5.2 mmol/L 4.1 4.5 4.7  Chloride 96 - 106 mmol/L 99 100 98  CO2 20 - 29 mmol/L _0 Calcium 8.6 - 10.2 mg/dL 9.0 9.1 9.2  Total Protein 6.0 - 8.5 g/dL 6.3 6.4 6.8  Total Bilirubin 0.0 - 1.2 mg/dL 0.3 0.3 0.5  Alkaline Phos 39 - 117 IU/L 71 71 74  AST 0 - 40 IU/L _1 ALT 0 - 44 IU/L _2 2. Coronary artery disease involving native coronary artery of native heart without angina pectoris  Followed by cardiology on a regular basis. Denies chest pain, shortness of breath, leg swelling, palpitations, dizziness, or syncope.    3. HYPERCHOLESTEROLEMIA-PURE  Compliant with medications - Yes Current medications - rosuvastatin  Side effects from  medications - No Diet - tries to watch diet Exercise - none  Lab Results  Component Value Date   CHOL 116 05/28/2018   HDL 40 05/28/2018   LDLCALC 63 05/28/2018   TRIG 67 05/28/2018   CHOLHDL 2.9 05/28/2018     Family and personal medical history reviewed. Smoking and ETOH history reviewed.    4. Vitamin D deficiency  Pt is taking oral repletion therapy. Denies bone pain and tenderness, muscle weakness, fracture, and difficulty walking. Lab Results  Component Value Date   VD25OH 48.6 05/28/2018   VD25OH 59.5 10/03/2017   VD25OH 64.0 04/04/2017   Lab Results  Component Value Date   CALCIUM 9.0 05/28/2018      5. TOBACCO ABUSE  Currently smokes at least 1 PPD. Denies desire to quit at this time.    6. Recovering alcoholic in remission ([place])  Pt doing very well. In recovery for 13 years.      Relevant past medical, surgical, family, and social history reviewed and updated as indicated.  Allergies and medications reviewed and updated. Date reviewed: Chart in Epic.   Past Medical History:  Diagnosis Date   Coronary artery disease    s/p DES to LAD, occluded RCA   H/O: alcohol abuse    Hyperlipidemia    Hypertension  Ischemic cardiomyopathy    Post-infarction apical thrombus [facility])     Past Surgical History:  Procedure Laterality Date   CORONARY STENT PLACEMENT N/A 08/2008   None      Social History   Socioeconomic History   Marital status: Divorced    Spouse name: Not on file   Number of children: 3   Years of education: Not on file   Highest education level: Not on file  Occupational History   Occupation: retired     Comment: Scientist, research (life sciences) strain: Not on file   Food insecurity    Worry: Not on file    Inability: Not on file   Transportation needs    Medical: Not on file    Non-medical: Not on file  Tobacco Use   Smoking status: Current Every Day Smoker    Packs/day: 1.00    Types: Cigarettes      Start date: 06/22/1971   Smokeless tobacco: Never Used  Substance and Sexual Activity   Alcohol use: No   Drug use: No   Sexual activity: Not on file  Lifestyle   Physical activity    Days per week: Not on file    Minutes per session: Not on file   Stress: Not on file  Relationships   Social connections    Talks on phone: Not on file    Gets together: Not on file    Attends religious service: Not on file    Active member of club or organization: Not on file    Attends meetings of clubs or organizations: Not on file    Relationship status: Not on file   Intimate partner violence    Fear of current or ex partner: Not on file    Emotionally abused: Not on file    Physically abused: Not on file    Forced sexual activity: Not on file  Other Topics Concern   Not on file  Social History Narrative   Not on file    Outpatient Encounter Medications as of 01/22/2019  Medication Sig   aspirin 81 MG tablet Take 81 mg by mouth daily.     carvedilol (COREG) 3.125 MG tablet Take 1 tablet (3.125 mg total) by mouth 2 (two) times daily. (Please make 6 mos with new PCP)   Cholecalciferol (VITAMIN D3) 2000 UNITS TABS Take 1 tablet by mouth daily.     clopidogrel (PLAVIX) 75 MG tablet TAKE 1 TABLET EVERY DAY   lisinopril (ZESTRIL) 2.5 MG tablet Take 1 tablet (2.5 mg total) by mouth daily.   lisinopril (ZESTRIL) 5 MG tablet TAKE 1 TABLET DAILY. TAKE ALONG WITH 2.5 MG TABLET TO MAKE 7.5 MG.   rosuvastatin (CRESTOR) 10 MG tablet Take 1 tablet (10 mg total) by mouth daily.   [DISCONTINUED] carvedilol (COREG) 3.125 MG tablet Take 1 tablet (3.125 mg total) by mouth 2 (two) times daily. (Please make 6 mos with new PCP)   [DISCONTINUED] lisinopril (PRINIVIL,ZESTRIL) 2.5 MG tablet TAKE 1 TABLET BY MOUTH EVERY DAY   [DISCONTINUED] lisinopril (PRINIVIL,ZESTRIL) 5 MG tablet TAKE 1 TABLET DAILY. TAKE ALONG WITH 2.5 MG TABLET TO MAKE 7.5 MG.   [DISCONTINUED] rosuvastatin (CRESTOR) 10 MG  tablet Take 1 tablet (10 mg total) by mouth daily.   nitroGLYCERIN (NITROSTAT) 0.4 MG SL tablet PLACE 1 TABLET (0.4 MG TOTAL) UNDER THE TONGUE EVERY 5 (FIVE) MINUTES AS NEEDED. (Patient not taking: Reported on 01/22/2019)   No facility-administered encounter medications on file as  of 01/22/2019.     Allergies  Allergen Reactions   Penicillins     REACTION: Reaction not known    Review of Systems  Constitutional: Negative for activity change, appetite change, chills, fatigue and fever.  HENT: Negative.   Eyes: Negative.  Negative for photophobia and visual disturbance.  Respiratory: Negative for cough, chest tightness and shortness of breath.   Cardiovascular: Negative for chest pain, palpitations and leg swelling.  Gastrointestinal: Negative for abdominal distention, abdominal pain, anal bleeding, blood in stool, constipation, diarrhea, nausea, rectal pain and vomiting.  Endocrine: Negative.  Negative for cold intolerance, heat intolerance, polydipsia, polyphagia and polyuria.  Genitourinary: Negative for decreased urine volume, difficulty urinating, dysuria, frequency and urgency.  Musculoskeletal: Negative for arthralgias and myalgias.  Skin: Negative.   Allergic/Immunologic: Negative.   Neurological: Negative for dizziness, tremors, seizures, syncope, facial asymmetry, speech difficulty, weakness, light-headedness, numbness and headaches.  Hematological: Negative.   Psychiatric/Behavioral: Negative for confusion, hallucinations, sleep disturbance and suicidal ideas.  All other systems reviewed and are negative.       Objective:  BP (!) 147/79    Pulse (!) 54    Temp 98.6 F (37 C)    Resp 20    Ht 5' 9" (1.753 m)    Wt 153 lb (69.4 kg)    SpO2 99%    BMI 22.59 kg/m    Wt Readings from Last 3 Encounters:  01/22/19 153 lb (69.4 kg)  09/27/18 160 lb (72.6 kg)  05/24/18 160 lb (72.6 kg)    Physical Exam Vitals signs and nursing note reviewed.  Constitutional:       General: He is not in acute distress.    Appearance: Normal appearance. He is well-developed and well-groomed. He is not ill-appearing, toxic-appearing or diaphoretic.  HENT:     Head: Normocephalic and atraumatic.     Jaw: There is normal jaw occlusion.     Right Ear: Hearing, tympanic membrane, ear canal and external ear normal.     Left Ear: Hearing, tympanic membrane, ear canal and external ear normal.     Nose: Nose normal.     Mouth/Throat:     Lips: Pink.     Mouth: Mucous membranes are moist.     Pharynx: Oropharynx is clear. Uvula midline.  Eyes:     General: Lids are normal.     Extraocular Movements: Extraocular movements intact.     Conjunctiva/sclera: Conjunctivae normal.     Pupils: Pupils are equal, round, and reactive to light.  Neck:     Musculoskeletal: Normal range of motion and neck supple.     Thyroid: No thyroid mass, thyromegaly or thyroid tenderness.     Vascular: No carotid bruit or JVD.     Trachea: Trachea and phonation normal.  Cardiovascular:     Rate and Rhythm: Normal rate and regular rhythm.     Chest Wall: PMI is not displaced.     Pulses: Normal pulses.     Heart sounds: Normal heart sounds. No murmur. No friction rub. No gallop.   Pulmonary:     Effort: Pulmonary effort is normal. No respiratory distress.     Breath sounds: Normal breath sounds. No wheezing.  Abdominal:     General: Bowel sounds are normal. There is no distension or abdominal bruit.     Palpations: Abdomen is soft. There is no hepatomegaly or splenomegaly.     Tenderness: There is no abdominal tenderness. There is no right CVA tenderness or left CVA  tenderness.     Hernia: No hernia is present.  Musculoskeletal: Normal range of motion.     Right lower leg: No edema.     Left lower leg: No edema.  Lymphadenopathy:     Cervical: No cervical adenopathy.  Skin:    General: Skin is warm and dry.     Capillary Refill: Capillary refill takes less than 2 seconds.     Coloration:  Skin is not cyanotic, jaundiced or pale.     Findings: No rash.  Neurological:     General: No focal deficit present.     Mental Status: He is alert and oriented to person, place, and time.     Cranial Nerves: Cranial nerves are intact. No cranial nerve deficit.     Sensory: Sensation is intact. No sensory deficit.     Motor: Motor function is intact. No weakness.     Coordination: Coordination is intact. Coordination normal.     Gait: Gait is intact. Gait normal.     Deep Tendon Reflexes: Reflexes are normal and symmetric. Reflexes normal.  Psychiatric:        Attention and Perception: Attention and perception normal.        Mood and Affect: Mood and affect normal.        Speech: Speech normal.        Behavior: Behavior normal. Behavior is cooperative.        Thought Content: Thought content normal.        Cognition and Memory: Cognition and memory normal.        Judgment: Judgment normal.     Results for orders placed or performed in visit on 05/24/18  Microscopic Examination   URINE  Result Value Ref Range   WBC, UA None seen 0 - 5 /hpf   RBC, UA None seen 0 - 2 /hpf   Epithelial Cells (non renal) None seen 0 - 10 /hpf   Renal Epithel, UA None seen None seen /hpf   Bacteria, UA None seen None seen/Few  CBC with Differential/Platelet  Result Value Ref Range   WBC 6.8 3.4 - 10.8 x10E3/uL   RBC 4.93 4.14 - 5.80 x10E6/uL   Hemoglobin 16.0 13.0 - 17.7 g/dL   Hematocrit 46.5 37.5 - 51.0 %   MCV 94 79 - 97 fL   MCH 32.5 26.6 - 33.0 pg   MCHC 34.4 31.5 - 35.7 g/dL   RDW 12.1 11.6 - 15.4 %   Platelets 190 150 - 450 x10E3/uL   Neutrophils 54 Not Estab. %   Lymphs 34 Not Estab. %   Monocytes 8 Not Estab. %   Eos 3 Not Estab. %   Basos 1 Not Estab. %   Neutrophils Absolute 3.7 1.4 - 7.0 x10E3/uL   Lymphocytes Absolute 2.3 0.7 - 3.1 x10E3/uL   Monocytes Absolute 0.5 0.1 - 0.9 x10E3/uL   EOS (ABSOLUTE) 0.2 0.0 - 0.4 x10E3/uL   Basophils Absolute 0.1 0.0 - 0.2 x10E3/uL    Immature Granulocytes 0 Not Estab. %   Immature Grans (Abs) 0.0 0.0 - 0.1 x10E3/uL  BMP8+EGFR  Result Value Ref Range   Glucose 83 65 - 99 mg/dL   BUN 10 8 - 27 mg/dL   Creatinine, Ser 0.96 0.76 - 1.27 mg/dL   GFR calc non Af Amer 81 >59 mL/min/1.73   GFR calc Af Amer 94 >59 mL/min/1.73   BUN/Creatinine Ratio 10 10 - 24   Sodium 139 134 - 144 mmol/L   Potassium 4.1  3.5 - 5.2 mmol/L   Chloride 99 96 - 106 mmol/L   CO2 24 20 - 29 mmol/L   Calcium 9.0 8.6 - 10.2 mg/dL  Lipid panel  Result Value Ref Range   Cholesterol, Total 116 100 - 199 mg/dL   Triglycerides 67 0 - 149 mg/dL   HDL 40 >39 mg/dL   VLDL Cholesterol Cal 13 5 - 40 mg/dL   LDL Calculated 63 0 - 99 mg/dL   Chol/HDL Ratio 2.9 0.0 - 5.0 ratio  VITAMIN D 25 Hydroxy (Vit-D Deficiency, Fractures)  Result Value Ref Range   Vit D, 25-Hydroxy 48.6 30.0 - 100.0 ng/mL  Hepatic function panel  Result Value Ref Range   Total Protein 6.3 6.0 - 8.5 g/dL   Albumin 4.6 3.8 - 4.8 g/dL   Bilirubin Total 0.3 0.0 - 1.2 mg/dL   Bilirubin, Direct 0.10 0.00 - 0.40 mg/dL   Alkaline Phosphatase 71 39 - 117 IU/L   AST 15 0 - 40 IU/L   ALT 13 0 - 44 IU/L  PSA, total and free  Result Value Ref Range   Prostate Specific Ag, Serum 1.1 0.0 - 4.0 ng/mL   PSA, Free 0.20 N/A ng/mL   PSA, Free Pct 18.2 %  Urinalysis, Complete  Result Value Ref Range   Specific Gravity, UA 1.015 1.005 - 1.030   pH, UA 8.0 (H) 5.0 - 7.5   Color, UA Yellow Yellow   Appearance Ur Clear Clear   Leukocytes, UA Negative Negative   Protein, UA Negative Negative/Trace   Glucose, UA Negative Negative   Ketones, UA Negative Negative   RBC, UA Negative Negative   Bilirubin, UA Negative Negative   Urobilinogen, Ur 2.0 (H) 0.2 - 1.0 mg/dL   Nitrite, UA Negative Negative   Microscopic Examination See below:        Pertinent labs & imaging results that were available during my care of the patient were reviewed by me and considered in my medical decision  making.  Assessment & Plan:  John Leon was seen today for medical management of chronic issues, hypertension and hyperlipidemia.  Diagnoses and all orders for this visit:  Essential hypertension BP fairly controlled. Changes were not made in regimen today. Discussed diet in detail. Daily blood pressure log given with instructions on how to fill out and told to bring to next visit. Goal BP 130/80. Pt aware to report any persistent high or low readings. DASH diet and exercise encouraged. Exercise at least 150 minutes per week and increase as tolerated. Goal BMI > 25. Stress management encouraged. Smoking cessation discussed. Avoid excessive alcohol. Avoid NSAID's. Avoid more than 2000 mg of sodium daily. Medications as prescribed. Follow up as scheduled.  -     carvedilol (COREG) 3.125 MG tablet; Take 1 tablet (3.125 mg total) by mouth 2 (two) times daily. (Please make 6 mos with new PCP) -     lisinopril (ZESTRIL) 2.5 MG tablet; Take 1 tablet (2.5 mg total) by mouth daily. -     lisinopril (ZESTRIL) 5 MG tablet; TAKE 1 TABLET DAILY. TAKE ALONG WITH 2.5 MG TABLET TO MAKE 7.5 MG. -     CMP14+EGFR -     CBC with Differential/Platelet -     Thyroid Panel With TSH  Coronary artery disease involving native coronary artery of native heart without angina pectoris Followed by cardiology on a regular basis. Has not had to use SL NTG recently. Continue below. Keep follow ups with cardiology as scheduled.  -  carvedilol (COREG) 3.125 MG tablet; Take 1 tablet (3.125 mg total) by mouth 2 (two) times daily. (Please make 6 mos with new PCP)  HYPERCHOLESTEROLEMIA-PURE Diet encouraged - increase intake of fresh fruits and vegetables, increase intake of lean proteins. Bake, broil, or grill foods. Avoid fried, greasy, and fatty foods. Avoid fast foods. Increase intake of fiber-rich whole grains. Exercise encouraged - at least 150 minutes per week and advance as tolerated. Last lipid panel good, will recheck at next  visit.  Goal BMI < 25. Continue medications as prescribed. Follow up in 3-6 months as discussed.  -     rosuvastatin (CRESTOR) 10 MG tablet; Take 1 tablet (10 mg total) by mouth daily.  Vitamin D deficiency Labs pending. Continue repletion therapy. If indicated, will change repletion dosage. Eat foods rich in Vit D including milk, orange juice, yogurt with vitamin D added, salmon or mackerel, canned tuna fish, cereals with vitamin D added, and cod liver oil. Get out in the sun but make sure to wear at least SPF 30 sunscreen.  -     VITAMIN D 25 Hydroxy (Vit-D Deficiency, Fractures)  TOBACCO ABUSE Smoking cessation counseling declined. Pt states he is not ready to quit at this time.   Recovering alcoholic in remission ([place]) 13 years sober. Pt encouraged to keep up the great work.    Influenza vaccine given today.   Continue all other maintenance medications.  Follow up plan: Return in about 6 months (around 07/22/2019), or if symptoms worsen or fail to improve, for HTN, Lipid.  Continue healthy lifestyle choices, including diet (rich in fruits, vegetables, and lean proteins, and low in salt and simple carbohydrates) and exercise (at least 30 minutes of moderate physical activity daily).  Educational handout given for DASH  The above assessment and management plan was discussed with the patient. The patient verbalized understanding of and has agreed to the management plan. Patient is aware to call the clinic if they develop any new symptoms or if symptoms persist or worsen. Patient is aware when to return to the clinic for a follow-up visit. Patient educated on when it is appropriate to go to the emergency department.   Monia Pouch, FNP-C [facility] [phone]

## 2019-01-22 NOTE — Patient Instructions (Signed)
DASH Eating Plan DASH stands for "Dietary Approaches to Stop Hypertension." The DASH eating plan is a healthy eating plan that has been shown to reduce high blood pressure (hypertension). Additional health benefits may include reducing the risk of type 2 diabetes mellitus, heart disease, and stroke. The DASH eating plan may also help with weight loss.  WHAT DO I NEED TO KNOW ABOUT THE DASH EATING PLAN? For the DASH eating plan, you will follow these general guidelines:  Choose foods with a percent daily value for sodium of less than 5% (as listed on the food label).  Use salt-free seasonings or herbs instead of table salt or sea salt.  Check with your health care provider or pharmacist before using salt substitutes.  Eat lower-sodium products, often labeled as "lower sodium" or "no salt added."  Eat fresh foods.  Eat more vegetables, fruits, and low-fat dairy products.  Choose whole grains. Look for the word "whole" as the first word in the ingredient list.  Choose fish and skinless chicken or turkey more often than red meat. Limit fish, poultry, and meat to 6 oz (170 g) each day.  Limit sweets, desserts, sugars, and sugary drinks.  Choose heart-healthy fats.  Limit cheese to 1 oz (28 g) per day.  Eat more home-cooked food and less restaurant, buffet, and fast food.  Limit fried foods.  Cook foods using methods other than frying.  Limit canned vegetables. If you do use them, rinse them well to decrease the sodium.  When eating at a restaurant, ask that your food be prepared with less salt, or no salt if possible.  WHAT FOODS CAN I EAT? Seek help from a dietitian for individual calorie needs.  Grains Whole grain or whole wheat bread. Brown rice. Whole grain or whole wheat pasta. Quinoa, bulgur, and whole grain cereals. Low-sodium cereals. Corn or whole wheat flour tortillas. Whole grain cornbread. Whole grain crackers. Low-sodium crackers.  Vegetables Fresh or frozen  vegetables (raw, steamed, roasted, or grilled). Low-sodium or reduced-sodium tomato and vegetable juices. Low-sodium or reduced-sodium tomato sauce and paste. Low-sodium or reduced-sodium canned vegetables.   Fruits All fresh, canned (in natural juice), or frozen fruits.  Meat and Other Protein Products Ground beef (85% or leaner), grass-fed beef, or beef trimmed of fat. Skinless chicken or turkey. Ground chicken or turkey. Pork trimmed of fat. All fish and seafood. Eggs. Dried beans, peas, or lentils. Unsalted nuts and seeds. Unsalted canned beans.  Dairy Low-fat dairy products, such as skim or 1% milk, 2% or reduced-fat cheeses, low-fat ricotta or cottage cheese, or plain low-fat yogurt. Low-sodium or reduced-sodium cheeses.  Fats and Oils Tub margarines without trans fats. Light or reduced-fat mayonnaise and salad dressings (reduced sodium). Avocado. Safflower, olive, or canola oils. Natural peanut or almond butter.  Other Unsalted popcorn and pretzels. The items listed above may not be a complete list of recommended foods or beverages. Contact your dietitian for more options.  WHAT FOODS ARE NOT RECOMMENDED?  Grains White bread. White pasta. White rice. Refined cornbread. Bagels and croissants. Crackers that contain trans fat.  Vegetables Creamed or fried vegetables. Vegetables in a cheese sauce. Regular canned vegetables. Regular canned tomato sauce and paste. Regular tomato and vegetable juices.  Fruits Dried fruits. Canned fruit in light or heavy syrup. Fruit juice.  Meat and Other Protein Products Fatty cuts of meat. Ribs, chicken wings, bacon, sausage, bologna, salami, chitterlings, fatback, hot dogs, bratwurst, and packaged luncheon meats. Salted nuts and seeds. Canned beans with salt.    Dairy Whole or 2% milk, cream, half-and-half, and cream cheese. Whole-fat or sweetened yogurt. Full-fat cheeses or blue cheese. Nondairy creamers and whipped toppings. Processed cheese,  cheese spreads, or cheese curds.  Condiments Onion and garlic salt, seasoned salt, table salt, and sea salt. Canned and packaged gravies. Worcestershire sauce. Tartar sauce. Barbecue sauce. Teriyaki sauce. Soy sauce, including reduced sodium. Steak sauce. Fish sauce. Oyster sauce. Cocktail sauce. Horseradish. Ketchup and mustard. Meat flavorings and tenderizers. Bouillon cubes. Hot sauce. Tabasco sauce. Marinades. Taco seasonings. Relishes.  Fats and Oils Butter, stick margarine, lard, shortening, ghee, and bacon fat. Coconut, palm kernel, or palm oils. Regular salad dressings.  Other Pickles and olives. Salted popcorn and pretzels.  The items listed above may not be a complete list of foods and beverages to avoid. Contact your dietitian for more information.  WHERE CAN I FIND MORE INFORMATION? National Heart, Lung, and [employer] Institute: www.nhlbi.nih.gov/health/health-topics/topics/dash/ Document Released: 04/06/2011 Document Revised: 09/01/2013 Document Reviewed: 02/19/2013 Exit[employer] Patient Information 2015 [employer]. This information is not intended to replace advice given to you by your health care provider. Make sure you discuss any questions you have with your health care provider.   I think that you would greatly benefit from seeing a nutritionist.  If you are interested, please call Dr Sykes at [phone] to schedule an appointment.   

## 2019-01-23 LAB — CMP14+EGFR
ALT: 6 IU/L (ref 0–44)
AST: 14 IU/L (ref 0–40)
Albumin/Globulin Ratio: 2.4 — ABNORMAL HIGH (ref 1.2–2.2)
Albumin: 4.3 g/dL (ref 3.8–4.8)
Alkaline Phosphatase: 71 IU/L (ref 39–117)
BUN/Creatinine Ratio: 14 (ref 10–24)
BUN: 12 mg/dL (ref 8–27)
Bilirubin Total: 0.4 mg/dL (ref 0.0–1.2)
CO2: 26 mmol/L (ref 20–29)
Calcium: 8.9 mg/dL (ref 8.6–10.2)
Chloride: 100 mmol/L (ref 96–106)
Creatinine, Ser: 0.87 mg/dL (ref 0.76–1.27)
GFR calc Af Amer: 102 mL/min/{1.73_m2} (ref >59)
GFR calc non Af Amer: 88 mL/min/{1.73_m2} (ref >59)
Globulin, Total: 1.8 g/dL (ref 1.5–4.5)
Glucose: 98 mg/dL (ref 65–99)
Potassium: 3.9 mmol/L (ref 3.5–5.2)
Sodium: 140 mmol/L (ref 134–144)
Total Protein: 6.1 g/dL (ref 6.0–8.5)

## 2019-01-23 LAB — CBC WITH DIFFERENTIAL/PLATELET
Basophils Absolute: 0.1 10*3/uL (ref 0.0–0.2)
Basos: 1 %
EOS (ABSOLUTE): 0.1 10*3/uL (ref 0.0–0.4)
Eos: 2 %
Hematocrit: 45.3 % (ref 37.5–51.0)
Hemoglobin: 15.3 g/dL (ref 13.0–17.7)
Immature Grans (Abs): 0 10*3/uL (ref 0.0–0.1)
Immature Granulocytes: 0 %
Lymphocytes Absolute: 2.7 10*3/uL (ref 0.7–3.1)
Lymphs: 36 %
MCH: 31.2 pg (ref 26.6–33.0)
MCHC: 33.8 g/dL (ref 31.5–35.7)
MCV: 92 fL (ref 79–97)
Monocytes Absolute: 0.7 10*3/uL (ref 0.1–0.9)
Monocytes: 9 %
Neutrophils Absolute: 3.9 10*3/uL (ref 1.4–7.0)
Neutrophils: 52 %
Platelets: 177 10*3/uL (ref 150–450)
RBC: 4.9 x10E6/uL (ref 4.14–5.80)
RDW: 12.7 % (ref 11.6–15.4)
WBC: 7.5 10*3/uL (ref 3.4–10.8)

## 2019-01-23 LAB — THYROID PANEL WITH TSH
Free Thyroxine Index: 2.5 (ref 1.2–4.9)
T3 Uptake Ratio: 27 % (ref 24–39)
T4, Total: 9.4 ug/dL (ref 4.5–12.0)
TSH: 2.3 u[IU]/mL (ref 0.450–4.500)

## 2019-01-23 LAB — VITAMIN D 25 HYDROXY (VIT D DEFICIENCY, FRACTURES): Vit D, 25-Hydroxy: 49 ng/mL (ref 30.0–100.0)

## 2019-02-10 ENCOUNTER — Telehealth: Payer: Self-pay | Admitting: *Deleted

## 2019-02-10 NOTE — Telephone Encounter (Signed)
A message was left, re: his follow up visit. 

## 2019-05-04 NOTE — Progress Notes (Signed)
Cardiology Office Note   Date:  05/07/2019   ID:  John Leon, DOB 09-23-1950, MRN [id number]  PCP:  Sonny Masters, FNP  Cardiologist:   Rollene Rotunda, MD   Chief Complaint  Patient presents with  . Coronary Artery Disease      History of Present Illness: John Leon is a 69 y.o. male who presents for follow up of CAD.  Since I last saw him he has done well.  He is 13-1/2 years sober.  He is being physically active.  He still works part-time.   The patient denies any new symptoms such as chest discomfort, neck or arm discomfort. There has been no new shortness of breath, PND or orthopnea. There have been no reported palpitations, presyncope or syncope.  He still smokes cigarettes.   Past Medical History:  Diagnosis Date  . Coronary artery disease    s/p DES to LAD, occluded RCA  . H/O: alcohol abuse   . Hyperlipidemia   . Hypertension   . Ischemic cardiomyopathy   . Post-infarction apical thrombus [facility])     Past Surgical History:  Procedure Laterality Date  . CORONARY STENT PLACEMENT N/A 08/2008  . None       Current Outpatient Medications  Medication Sig Dispense Refill  . aspirin 81 MG tablet Take 81 mg by mouth daily.      . carvedilol (COREG) 3.125 MG tablet Take 1 tablet (3.125 mg total) by mouth 2 (two) times daily. (Please make 6 mos with new PCP) 180 tablet 1  . Cholecalciferol (VITAMIN D3) 2000 UNITS TABS Take 1 tablet by mouth daily.      . clopidogrel (PLAVIX) 75 MG tablet TAKE 1 TABLET EVERY DAY 90 tablet 2  . lisinopril (ZESTRIL) 2.5 MG tablet Take 1 tablet (2.5 mg total) by mouth daily. 90 tablet 1  . lisinopril (ZESTRIL) 5 MG tablet TAKE 1 TABLET DAILY. TAKE ALONG WITH 2.5 MG TABLET TO MAKE 7.5 MG. 90 tablet 3  . nitroGLYCERIN (NITROSTAT) 0.4 MG SL tablet PLACE 1 TABLET (0.4 MG TOTAL) UNDER THE TONGUE EVERY 5 (FIVE) MINUTES AS NEEDED. 25 tablet 11  . rosuvastatin (CRESTOR) 10 MG tablet Take 1 tablet (10 mg total) by mouth daily. 90 tablet  1   No current facility-administered medications for this visit.    Allergies:   Penicillins    ROS:  Please see the history of present illness.   Otherwise, review of systems are positive for none.   All other systems are reviewed and negative.    PHYSICAL EXAM: VS:  BP 138/82   Pulse (!) 53   Ht 5\' 9"  (1.753 m)   Wt 154 lb (69.9 kg)   BMI 22.74 kg/m  , BMI Body mass index is 22.74 kg/m. GENERAL:  Well appearing NECK:  No jugular venous distention, waveform within normal limits, carotid upstroke brisk and symmetric, no bruits, no thyromegaly LUNGS:  Clear to auscultation bilaterally CHEST:  Unremarkable HEART:  PMI not displaced or sustained,S1 and S2 within normal limits, no S3, no S4, no clicks, no rubs, no murmurs ABD:  Flat, positive bowel sounds normal in frequency in pitch, no bruits, no rebound, no guarding, no midline pulsatile mass, no hepatomegaly, no splenomegaly EXT:  2 plus pulses throughout, no edema, no cyanosis no clubbing   EKG:  EKG is ordered today. The ekg ordered today demonstrates sinus rhythm, rate rate 53, old inferior infarct, no acute ST-T wave changes.   Recent Labs: 01/22/2019:  ALT 6; BUN 12; Creatinine, Ser 0.87; Hemoglobin 15.3; Platelets 177; Potassium 3.9; Sodium 140; TSH 2.300    Lipid Panel    Component Value Date/Time   CHOL 116 05/28/2018 0912   CHOL 146 10/16/2012 1058   TRIG 67 05/28/2018 0912   TRIG 94 01/24/2016 1044   TRIG 85 10/16/2012 1058   HDL 40 05/28/2018 0912   HDL 44 01/24/2016 1044   HDL 30 (L) 10/16/2012 1058   CHOLHDL 2.9 05/28/2018 0912   CHOLHDL 3.4 09/23/2008 0400   VLDL 9 09/23/2008 0400   LDLCALC 63 05/28/2018 0912   LDLCALC 124 (H) 10/27/2013 0932   LDLCALC 99 10/16/2012 1058      Wt Readings from Last 3 Encounters:  05/07/19 154 lb (69.9 kg)  01/22/19 153 lb (69.4 kg)  09/27/18 160 lb (72.6 kg)      Other studies Reviewed: Additional studies/ records that were reviewed today include:  None. Review of the above records demonstrates:  Please see elsewhere in the note.     ASSESSMENT AND PLAN:  CAD:   The patient has no new sypmtoms.  No further cardiovascular testing is indicated.  We will continue with aggressive risk reduction and meds as listed.  TOBACCO ABUSE:   We talked about this and he is unable to quit.   ISCHEMIC CARDIOMYOPATHY:    This has been mildly low in the past.  I would not see any reason to think this was different than no further imaging is indicated.   DYSLIPIDEMIA:   LDL last year was 87 with an HDL of 40.  I put in an order for him to get this.   COVID 19 EDUCATION: He is interested in the vaccine as soon as he can get it.  We talked about this.   Current medicines are reviewed at length with the patient today.  The patient does not have concerns regarding medicines.  The following changes have been made:  no change  Labs/ tests ordered today include:   Orders Placed This Encounter  Procedures  . Hepatic function panel  . Lipid Profile     Disposition:   FU with me in one year.     Signed, Minus Breeding, MD  05/07/2019 12:41 PM    [facility]

## 2019-05-07 ENCOUNTER — Ambulatory Visit (INDEPENDENT_AMBULATORY_CARE_PROVIDER_SITE_OTHER): Payer: Medicare Other | Admitting: Cardiology

## 2019-05-07 ENCOUNTER — Other Ambulatory Visit: Payer: Medicare Other

## 2019-05-07 ENCOUNTER — Other Ambulatory Visit: Payer: Self-pay

## 2019-05-07 ENCOUNTER — Encounter: Payer: Self-pay | Admitting: Cardiology

## 2019-05-07 VITALS — BP 138/82 | HR 53 | Ht 69.0 in | Wt 154.0 lb

## 2019-05-07 DIAGNOSIS — Z720 Tobacco use: Secondary | ICD-10-CM | POA: Diagnosis not present

## 2019-05-07 DIAGNOSIS — E785 Hyperlipidemia, unspecified: Secondary | ICD-10-CM

## 2019-05-07 DIAGNOSIS — Z79899 Other long term (current) drug therapy: Secondary | ICD-10-CM | POA: Diagnosis not present

## 2019-05-07 DIAGNOSIS — I255 Ischemic cardiomyopathy: Secondary | ICD-10-CM

## 2019-05-07 DIAGNOSIS — I2510 Atherosclerotic heart disease of native coronary artery without angina pectoris: Secondary | ICD-10-CM | POA: Diagnosis not present

## 2019-05-07 DIAGNOSIS — Z7189 Other specified counseling: Secondary | ICD-10-CM

## 2019-05-07 NOTE — Patient Instructions (Addendum)
Medication Instructions:  The current medical regimen is effective;  continue present plan and medications.  *If you need a refill on your cardiac medications before your next appointment, please call your pharmacy*  Please lab blood work at [facility] (Lipid/Hepatic panel)  Follow-Up: At [facility], you and your health needs are our priority.  As part of our continuing mission to provide you with exceptional heart care, we have created designated Provider Care Teams.  These Care Teams include your primary Cardiologist (physician) and Advanced Practice Providers (APPs -  Physician Assistants and Nurse Practitioners) who all work together to provide you with the care you need, when you need it.  Your next appointment:   12 month(s)  The format for your next appointment:   In Person  Provider:   Rollene Rotunda, MD  Thank you for choosing [facility]!!

## 2019-05-08 LAB — LIPID PANEL
Chol/HDL Ratio: 2.9 ratio (ref 0.0–5.0)
Cholesterol, Total: 128 mg/dL (ref 100–199)
HDL: 44 mg/dL (ref >39)
LDL Chol Calc (NIH): 69 mg/dL (ref 0–99)
Triglycerides: 76 mg/dL (ref 0–149)
VLDL Cholesterol Cal: 15 mg/dL (ref 5–40)

## 2019-05-08 LAB — HEPATIC FUNCTION PANEL
ALT: 9 IU/L (ref 0–44)
AST: 14 IU/L (ref 0–40)
Albumin: 4.6 g/dL (ref 3.8–4.8)
Alkaline Phosphatase: 75 IU/L (ref 39–117)
Bilirubin Total: 0.5 mg/dL (ref 0.0–1.2)
Bilirubin, Direct: 0.15 mg/dL (ref 0.00–0.40)
Total Protein: 6.9 g/dL (ref 6.0–8.5)

## 2019-05-09 NOTE — Addendum Note (Signed)
Addended by: Sharin Grave on: 05/09/2019 09:24 AM   Modules accepted: Orders

## 2019-05-14 ENCOUNTER — Other Ambulatory Visit: Payer: Self-pay | Admitting: Cardiology

## 2019-07-22 ENCOUNTER — Other Ambulatory Visit: Payer: Self-pay | Admitting: Family Medicine

## 2019-07-22 DIAGNOSIS — I10 Essential (primary) hypertension: Secondary | ICD-10-CM

## 2019-07-22 DIAGNOSIS — I2510 Atherosclerotic heart disease of native coronary artery without angina pectoris: Secondary | ICD-10-CM

## 2019-07-24 ENCOUNTER — Ambulatory Visit: Payer: Medicare Other | Admitting: Family Medicine

## 2019-08-07 ENCOUNTER — Ambulatory Visit: Payer: Medicare Other | Admitting: Family Medicine

## 2019-08-24 IMAGING — DX DG CHEST 2V
2 series · 2 of 2 positions shown · non-contrast
Comparison: 01/27/2016

CLINICAL DATA: Essential hypertension, hyperlipidemia, smoker,
history of coronary artery disease post MI, ischemic cardiomyopathy

EXAM:
CHEST - 2 VIEW

[chest pa]
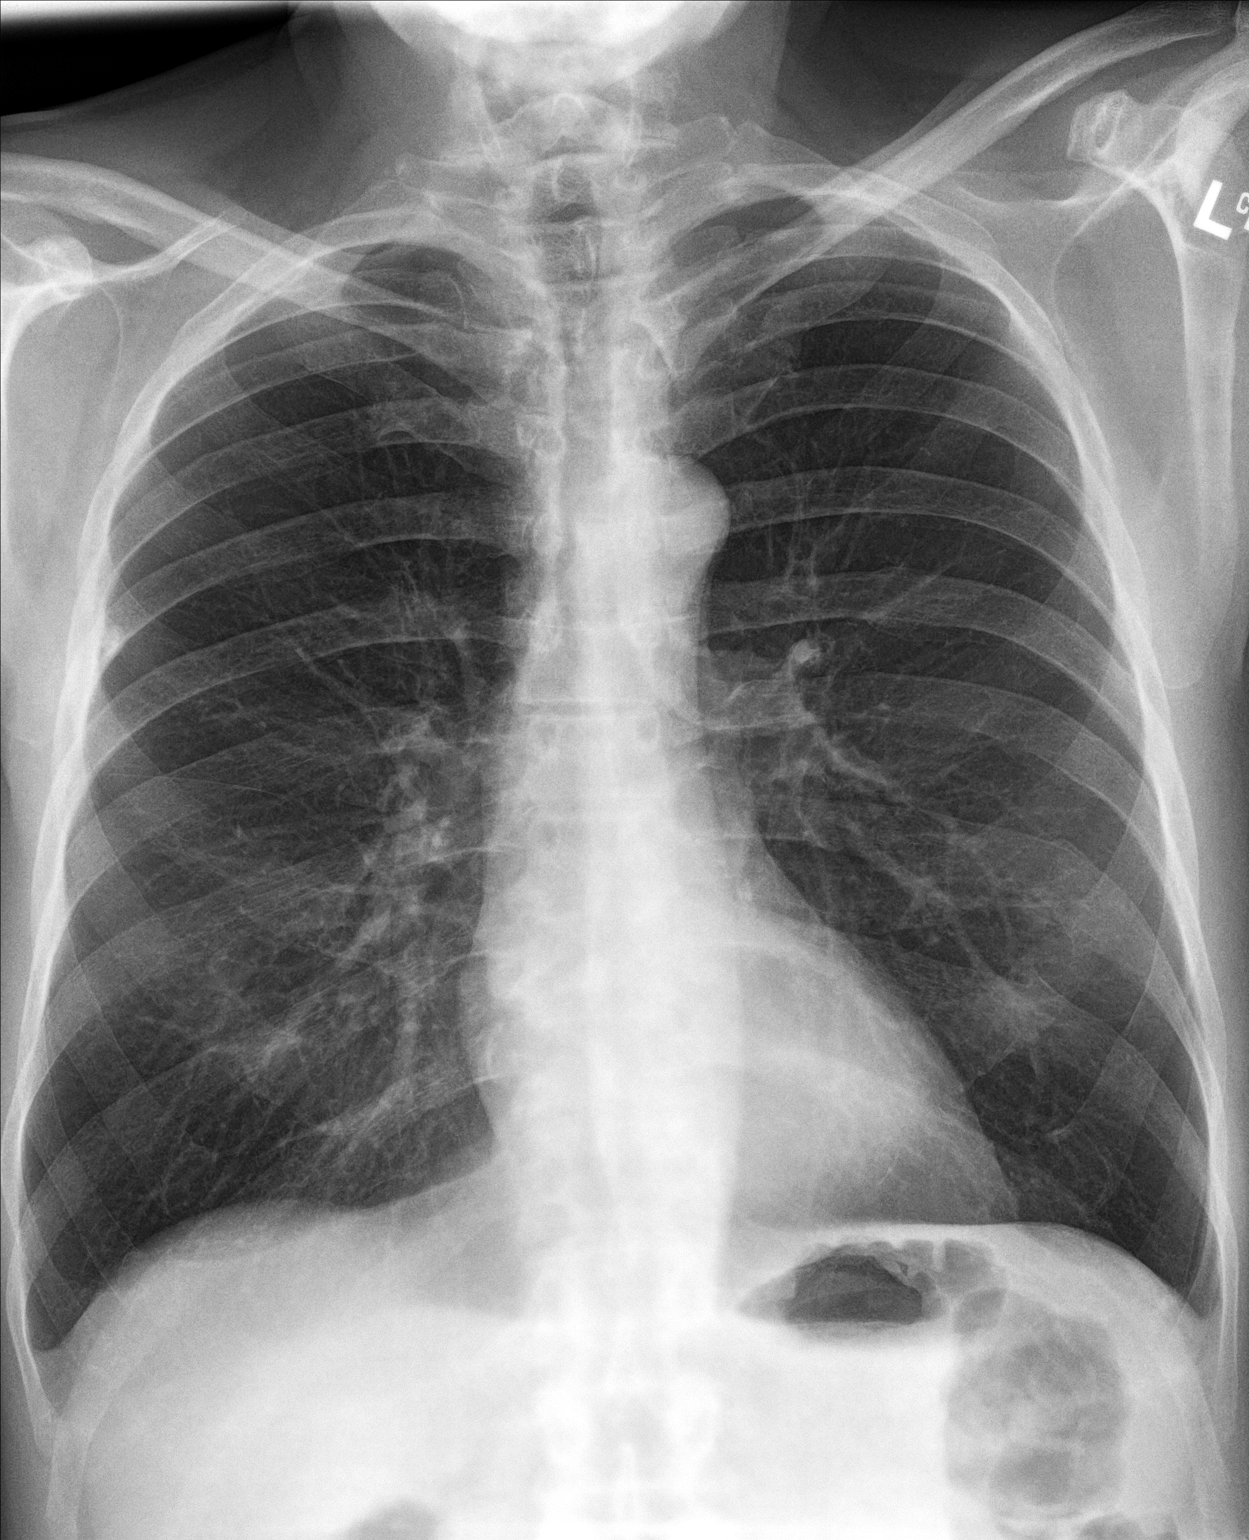

[chest lat]
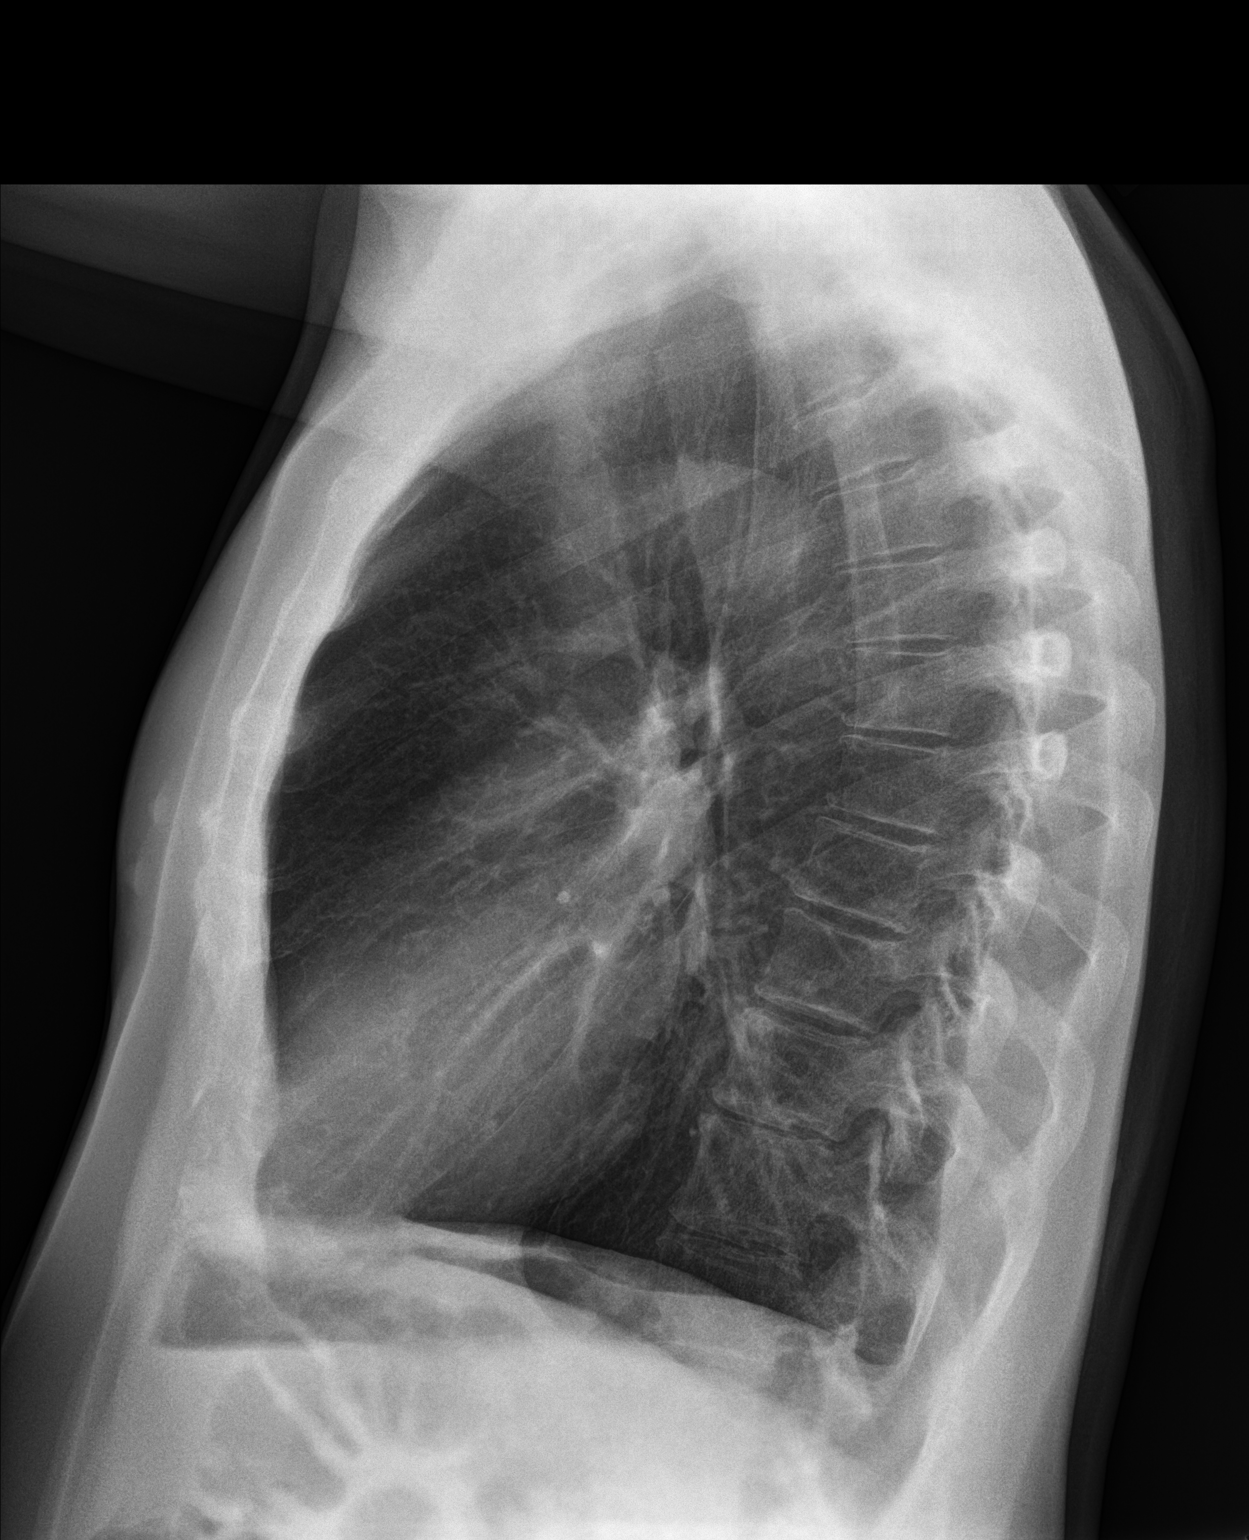

[2 of 2 positions shown; findings below may reference images not displayed]

FINDINGS: Normal heart size, mediastinal contours, and pulmonary vascularity.

Lungs hyperinflated but clear.

No pulmonary infiltrate, pleural effusion or pneumothorax.

Calcified granuloma versus small exostosis at a lateral mid RIGHT
rib unchanged.

Bones otherwise unremarkable.
IMPRESSION: Hyperinflated lungs without infiltrate.

## 2019-09-18 ENCOUNTER — Other Ambulatory Visit: Payer: Self-pay | Admitting: *Deleted

## 2019-09-18 DIAGNOSIS — I10 Essential (primary) hypertension: Secondary | ICD-10-CM

## 2019-09-18 MED ORDER — LISINOPRIL 2.5 MG PO TABS: 2.5000 mg | ORAL_TABLET | Freq: Every day | ORAL | 0 refills | Status: DC

## 2019-10-02 ENCOUNTER — Other Ambulatory Visit: Payer: Self-pay | Admitting: *Deleted

## 2019-10-02 DIAGNOSIS — E7800 Pure hypercholesterolemia, unspecified: Secondary | ICD-10-CM

## 2019-10-02 MED ORDER — ROSUVASTATIN CALCIUM 10 MG PO TABS: 10.0000 mg | ORAL_TABLET | Freq: Every day | ORAL | 0 refills | Status: DC

## 2019-10-06 ENCOUNTER — Ambulatory Visit (INDEPENDENT_AMBULATORY_CARE_PROVIDER_SITE_OTHER): Payer: Medicare Other | Admitting: *Deleted

## 2019-10-06 DIAGNOSIS — Z0000 Encounter for general adult medical examination without abnormal findings: Secondary | ICD-10-CM | POA: Diagnosis not present

## 2019-10-06 NOTE — Progress Notes (Signed)
MEDICARE ANNUAL WELLNESS VISIT  10/06/2019  Telephone Visit Disclaimer This Medicare AWV was conducted by telephone due to national recommendations for restrictions regarding the COVID-19 Pandemic (e.g. social distancing).  I verified, using two identifiers, that I am speaking with John Leon or their authorized healthcare agent. I discussed the limitations, risks, security, and privacy concerns of performing an evaluation and management service by telephone and the potential availability of an in-person appointment in the future. The patient expressed understanding and agreed to proceed.   Subjective:  John Leon is a 69 y.o. male patient of Rakes, Connye Burkitt, FNP who had a Medicare Annual Wellness Visit today via telephone. John Leon is Working part time and lives alone. he has 3 children. he reports that he is socially active and does interact with friends/family regularly. he is moderately physically active and enjoys reading and going to [employer].  Patient Care Team: Baruch Gouty, FNP as PCP - General (Family Medicine) Minus Breeding, MD as PCP - Cardiology (Cardiology)  Advanced Directives 10/06/2019 09/27/2018  Does Patient Have a Medical Advance Directive? No No  Would patient like information on creating a medical advance directive? No - Patient declined No - Patient declined    Hospital Utilization Over the Past 12 Months: # of hospitalizations or ER visits: 0 # of surgeries: 0  Review of Systems    Patient reports that his overall health is unchanged compared to last year.  History obtained from chart review  Patient Reported Readings (BP, Pulse, CBG, Weight, etc) none  Pain Assessment Pain : No/denies pain     Current Medications & Allergies (verified) Allergies as of 10/06/2019      Reactions   Penicillins    REACTION: Reaction not known      Medication List       Accurate as of October 06, 2019  9:45 AM. If you have any questions, ask your nurse  or doctor.        aspirin 81 MG tablet Take 81 mg by mouth daily.   carvedilol 3.125 MG tablet Commonly known as: COREG Take 1 tablet (3.125 mg total) by mouth 2 (two) times daily.   clopidogrel 75 MG tablet Commonly known as: PLAVIX Take 1 tablet (75 mg total) by mouth daily.   lisinopril 5 MG tablet Commonly known as: ZESTRIL TAKE 1 TABLET DAILY. TAKE ALONG WITH 2.5 MG TABLET TO MAKE 7.5 MG.   lisinopril 2.5 MG tablet Commonly known as: ZESTRIL Take 1 tablet (2.5 mg total) by mouth daily. (Needs to be seen before next refill)   nitroGLYCERIN 0.4 MG SL tablet Commonly known as: NITROSTAT PLACE 1 TABLET (0.4 MG TOTAL) UNDER THE TONGUE EVERY 5 (FIVE) MINUTES AS NEEDED.   rosuvastatin 10 MG tablet Commonly known as: CRESTOR Take 1 tablet (10 mg total) by mouth daily. (Needs to be seen before next refill)   Vitamin D3 50 MCG (2000 UT) Tabs Take 1 tablet by mouth daily.       History (reviewed): Past Medical History:  Diagnosis Date  . Coronary artery disease    s/p DES to LAD, occluded RCA  . H/O: alcohol abuse   . Hyperlipidemia   . Hypertension   . Ischemic cardiomyopathy   . Post-infarction apical thrombus [facility])    Past Surgical History:  Procedure Laterality Date  . CORONARY STENT PLACEMENT N/A 08/2008  . None     Family History  Problem Relation Age of Onset  . Heart attack  Father 60  . Aneurysm Father 68       brain  . CAD Father   . Dementia Mother   . Arthritis Maternal Grandmother   . Heart attack Maternal Grandfather   . Lung disease Brother        agent orange  . Healthy Daughter   . Healthy Son   . Healthy Son    Social History   Socioeconomic History  . Marital status: Divorced    Spouse name: Not on file  . Number of children: 3  . Years of education: 16  . Highest education level: Bachelor's degree (e.g., BA, AB, BS)  Occupational History  . Occupation: retired     Comment: Airline pilot  Tobacco Use  . Smoking status: Current  Every Day Smoker    Packs/day: 1.00    Types: Cigarettes    Start date: 06/22/1971  . Smokeless tobacco: Never Used  Substance and Sexual Activity  . Alcohol use: No  . Drug use: No  . Sexual activity: Not Currently  Other Topics Concern  . Not on file  Social History Narrative  . Not on file   Social Determinants of Health   Financial Resource Strain: Low Risk   . Difficulty of Paying Living Expenses: Not hard at all  Food Insecurity: No Food Insecurity  . Worried About Programme researcher, broadcasting/film/video in the Last Year: Never true  . Ran Out of Food in the Last Year: Never true  Transportation Needs: No Transportation Needs  . Lack of Transportation (Medical): No  . Lack of Transportation (Non-Medical): No  Physical Activity: Insufficiently Active  . Days of Exercise per Week: 5 days  . Minutes of Exercise per Session: 10 min  Stress: No Stress Concern Present  . Feeling of Stress : Not at all  Social Connections: Somewhat Isolated  . Frequency of Communication with Friends and Family: More than three times a week  . Frequency of Social Gatherings with Friends and Family: More than three times a week  . Attends Religious Services: Never  . Active Member of Clubs or Organizations: Yes  . Attends Banker Meetings: More than 4 times per year  . Marital Status: Divorced    Activities of Daily Living In your present state of health, do you have any difficulty performing the following activities: 10/06/2019  Hearing? N  Vision? N  Comment wears RX glasses for reading fine print-last eye exam 2 years ago  Difficulty concentrating or making decisions? N  Walking or climbing stairs? N  Dressing or bathing? N  Doing errands, shopping? N  Preparing Food and eating ? N  Using the Toilet? N  In the past six months, have you accidently leaked urine? N  Do you have problems with loss of bowel control? N  Managing your Medications? N  Managing your Finances? N  Housekeeping or  managing your Housekeeping? N  Some recent data might be hidden    Patient Education/ Literacy How often do you need to have someone help you when you read instructions, pamphlets, or other written materials from your doctor or pharmacy?: 1 - Never What is the last grade level you completed in school?: Bachelors Degree  Exercise Current Exercise Habits: Home exercise routine, Type of exercise: walking, Time (Minutes): 10, Frequency (Times/Week): 5, Weekly Exercise (Minutes/Week): 50, Intensity: Mild, Exercise limited by: cardiac condition(s)  Diet Patient reports consuming 2 meals a day and 2 snack(s) a day Patient reports that his primary diet  is: Regular Patient reports that she does have regular access to food.   Depression Screen PHQ 2/9 Scores 10/06/2019 01/22/2019 09/27/2018 05/24/2018 10/03/2017 04/04/2017 10/03/2016  PHQ - 2 Score 0 0 0 0 0 0 0     Fall Risk Fall Risk  10/06/2019 01/22/2019 09/27/2018 05/24/2018 10/03/2017  Falls in the past year? 0 0 0 0 No     Objective:  Othella Boyer seemed alert and oriented and he participated appropriately during our telephone visit.  Blood Pressure Weight BMI  BP Readings from Last 3 Encounters:  05/07/19 138/82  01/22/19 (!) 147/79  09/27/18 (!) 142/81   Wt Readings from Last 3 Encounters:  05/07/19 154 lb (69.9 kg)  01/22/19 153 lb (69.4 kg)  09/27/18 160 lb (72.6 kg)   BMI Readings from Last 1 Encounters:  05/07/19 22.74 kg/m    *Unable to obtain current vital signs, weight, and BMI due to telephone visit type  Hearing/Vision  . Yadir did not seem to have difficulty with hearing/understanding during the telephone conversation . Reports that he has not had a formal eye exam by an eye care professional within the past year . Reports that he has not had a formal hearing evaluation within the past year *Unable to fully assess hearing and vision during telephone visit type  Cognitive Function: 6CIT Screen 10/06/2019 09/27/2018  What  Year? 0 points 0 points  What month? 0 points 0 points  What time? 0 points 0 points  Count back from 20 0 points 0 points  Months in reverse 0 points 0 points  Repeat phrase 0 points 0 points  Total Score 0 0   (Normal:0-7, Significant for Dysfunction: >8)  Normal Cognitive Function Screening: Yes   Immunization & Health Maintenance Record Immunization History  Administered Date(s) Administered  . Fluad Quad(high Dose 65+) 01/22/2019  . Influenza Split 02/01/2013  . Influenza Whole 12/30/2009  . Influenza, High Dose Seasonal PF 04/04/2017, 04/10/2018  . Influenza,inj,Quad PF,6+ Mos 03/10/2015, 01/24/2016  . Pneumococcal Conjugate-13 12/11/2014  . Pneumococcal Polysaccharide-23 01/24/2016  . Td 07/30/2001  . Tdap 10/27/2013    Health Maintenance  Topic Date Due  . COVID-19 Vaccine (1) Never done  . INFLUENZA VACCINE  11/30/2019  . COLONOSCOPY  12/31/2019  . TETANUS/TDAP  10/28/2023  . Hepatitis C Screening  Completed  . PNA vac Low Risk Adult  Completed  . COLON CANCER SCREENING ANNUAL FOBT  Discontinued       Assessment  This is a routine wellness examination for Othella Boyer.  Health Maintenance: Due or Overdue Health Maintenance Due  Topic Date Due  . COVID-19 Vaccine (1) Never done    Othella Boyer does not need a referral for Community Assistance: Care Management:   no Social Work:    no Prescription Assistance:  no Nutrition/Diabetes Education:  no   Plan:  Personalized Goals Goals Addressed            This Visit's Progress   . DIET - INCREASE WATER INTAKE       Try to drink 6-8 glasses of water daily.      Personalized Health Maintenance & Screening Recommendations  Shingrix vaccine  Lung Cancer Screening Recommended: no (Low Dose CT Chest recommended if Age 20-80 years, 30 pack-year currently smoking OR have quit w/in past 15 years) Hepatitis C Screening recommended: no HIV Screening recommended: no  Advanced Directives:  Written information was not prepared per patient's request.  Referrals & Orders No orders of  the defined types were placed in this encounter.   Follow-up Plan . Follow-up with your PCP as planned . Bring your vaccine card for COVID by the office for [place] to copy . Consider Shingrix vaccine at your next visit with your PCP   I have personally reviewed and noted the following in the patient's chart:   . Medical and social history . Use of alcohol, tobacco or illicit drugs  . Current medications and supplements . Functional ability and status . Nutritional status . Physical activity . Advanced directives . List of other physicians . Hospitalizations, surgeries, and ER visits in previous 12 months . Vitals . Screenings to include cognitive, depression, and falls . Referrals and appointments  In addition, I have reviewed and discussed with Othella Boyer certain preventive protocols, quality metrics, and best practice recommendations. A written personalized care plan for preventive services as well as general preventive health recommendations is available and can be mailed to the patient at his request.      Hessie Diener, LPN  05/06/5788

## 2019-10-06 NOTE — Patient Instructions (Signed)

## 2019-10-14 ENCOUNTER — Other Ambulatory Visit: Payer: Self-pay | Admitting: *Deleted

## 2019-10-14 DIAGNOSIS — I10 Essential (primary) hypertension: Secondary | ICD-10-CM

## 2019-10-14 DIAGNOSIS — I2510 Atherosclerotic heart disease of native coronary artery without angina pectoris: Secondary | ICD-10-CM

## 2019-10-14 MED ORDER — CARVEDILOL 3.125 MG PO TABS: 3.1250 mg | ORAL_TABLET | Freq: Two times a day (BID) | ORAL | 0 refills | Status: DC

## 2019-10-22 ENCOUNTER — Other Ambulatory Visit: Payer: Self-pay | Admitting: Family

## 2019-10-22 DIAGNOSIS — I10 Essential (primary) hypertension: Secondary | ICD-10-CM

## 2019-10-22 NOTE — Telephone Encounter (Signed)
[place]. NTBS 30 days given 09/18/19 

## 2019-10-24 ENCOUNTER — Other Ambulatory Visit: Payer: Self-pay | Admitting: Family Medicine

## 2019-10-24 ENCOUNTER — Other Ambulatory Visit: Payer: Self-pay | Admitting: Family

## 2019-10-24 DIAGNOSIS — I2510 Atherosclerotic heart disease of native coronary artery without angina pectoris: Secondary | ICD-10-CM

## 2019-10-24 DIAGNOSIS — E7800 Pure hypercholesterolemia, unspecified: Secondary | ICD-10-CM

## 2019-10-24 DIAGNOSIS — I10 Essential (primary) hypertension: Secondary | ICD-10-CM

## 2019-10-27 NOTE — Telephone Encounter (Signed)
Last refill-30 days given on 10/24/19 No follow up scheduled Needs appt for refills

## 2019-10-28 NOTE — Telephone Encounter (Signed)
Patient has an appointment 10/29/2019 with Jannifer Rodney for med refills.

## 2019-10-29 ENCOUNTER — Encounter: Payer: Self-pay | Admitting: Family

## 2019-10-29 ENCOUNTER — Ambulatory Visit (INDEPENDENT_AMBULATORY_CARE_PROVIDER_SITE_OTHER): Payer: Medicare Other | Admitting: Family

## 2019-10-29 ENCOUNTER — Other Ambulatory Visit: Payer: Self-pay

## 2019-10-29 VITALS — BP 145/78 | HR 58 | Temp 97.6°F | Ht 69.0 in | Wt 149.4 lb

## 2019-10-29 DIAGNOSIS — I255 Ischemic cardiomyopathy: Secondary | ICD-10-CM

## 2019-10-29 DIAGNOSIS — Z720 Tobacco use: Secondary | ICD-10-CM

## 2019-10-29 DIAGNOSIS — I2510 Atherosclerotic heart disease of native coronary artery without angina pectoris: Secondary | ICD-10-CM

## 2019-10-29 DIAGNOSIS — I10 Essential (primary) hypertension: Secondary | ICD-10-CM | POA: Diagnosis not present

## 2019-10-29 DIAGNOSIS — E7800 Pure hypercholesterolemia, unspecified: Secondary | ICD-10-CM

## 2019-10-29 DIAGNOSIS — F1021 Alcohol dependence, in remission: Secondary | ICD-10-CM

## 2019-10-29 DIAGNOSIS — J410 Simple chronic bronchitis: Secondary | ICD-10-CM

## 2019-10-29 DIAGNOSIS — I426 Alcoholic cardiomyopathy: Secondary | ICD-10-CM

## 2019-10-29 DIAGNOSIS — E559 Vitamin D deficiency, unspecified: Secondary | ICD-10-CM

## 2019-10-29 DIAGNOSIS — F17200 Nicotine dependence, unspecified, uncomplicated: Secondary | ICD-10-CM

## 2019-10-29 MED ORDER — CARVEDILOL 3.125 MG PO TABS: 3.1250 mg | ORAL_TABLET | Freq: Two times a day (BID) | ORAL | 3 refills | Status: DC

## 2019-10-29 MED ORDER — LISINOPRIL 5 MG PO TABS: ORAL_TABLET | ORAL | 3 refills | Status: DC

## 2019-10-29 MED ORDER — LISINOPRIL 2.5 MG PO TABS: 2.5000 mg | ORAL_TABLET | Freq: Every day | ORAL | 3 refills | Status: DC

## 2019-10-29 MED ORDER — CLOPIDOGREL BISULFATE 75 MG PO TABS: 75.0000 mg | ORAL_TABLET | Freq: Every day | ORAL | 2 refills | Status: DC

## 2019-10-29 MED ORDER — ROSUVASTATIN CALCIUM 10 MG PO TABS: 10.0000 mg | ORAL_TABLET | Freq: Every day | ORAL | 2 refills | Status: DC

## 2019-10-29 NOTE — Patient Instructions (Signed)

## 2019-10-29 NOTE — Progress Notes (Signed)
Subjective:    Patient ID: John Leon, male    DOB: 1950/05/07, 69 y.o.   MRN: [id number]  Chief Complaint  Patient presents with  . Medical Management of Chronic Issues    rakes patient    PT presents to the office today to establish care with me. He is followed by cardiologist annually for CAD and alcoholic cardiomyopathy. He is a recovering alcoholic, but states his last drink was 11/04/05. Hypertension This is a chronic problem. The current episode started more than 1 year ago. The problem has been waxing and waning since onset. The problem is uncontrolled. Pertinent negatives include no malaise/fatigue, peripheral edema or shortness of breath. Risk factors for coronary artery disease include male gender, sedentary lifestyle and dyslipidemia. Past treatments include ACE inhibitors. The current treatment provides mild improvement. Hypertensive end-organ damage includes CAD/MI.  Hyperlipidemia This is a chronic problem. The current episode started more than 1 year ago. The problem is controlled. Recent lipid tests were reviewed and are normal. Pertinent negatives include no shortness of breath. Current antihyperlipidemic treatment includes statins. The current treatment provides moderate improvement of lipids. Risk factors for coronary artery disease include dyslipidemia, male sex, hypertension and a sedentary lifestyle.  Nicotine Dependence Presents for follow-up visit. His urge triggers include company of smokers. The symptoms have been stable. He smokes 1 pack of cigarettes per day.  COPD PT continues to smoke a pack a day. States his breathing is stable.     Review of Systems  Constitutional: Negative for malaise/fatigue.  Respiratory: Negative for shortness of breath.   All other systems reviewed and are negative.      Objective:   Physical Exam Vitals reviewed.  Constitutional:      General: He is not in acute distress.    Appearance: He is well-developed.  HENT:      Head: Normocephalic.     Right Ear: Tympanic membrane normal.     Left Ear: Tympanic membrane normal.  Eyes:     General:        Right eye: No discharge.        Left eye: No discharge.     Pupils: Pupils are equal, round, and reactive to light.  Neck:     Thyroid: No thyromegaly.  Cardiovascular:     Rate and Rhythm: Normal rate and regular rhythm.     Heart sounds: Normal heart sounds. No murmur heard.   Pulmonary:     Effort: Pulmonary effort is normal. No respiratory distress.     Breath sounds: Wheezing and rhonchi present.  Abdominal:     General: Bowel sounds are normal. There is no distension.     Palpations: Abdomen is soft.     Tenderness: There is no abdominal tenderness.  Musculoskeletal:        General: No tenderness. Normal range of motion.     Cervical back: Normal range of motion and neck supple.  Skin:    General: Skin is warm and dry.     Coloration: Skin is pale.     Findings: No erythema or rash.  Neurological:     Mental Status: He is alert and oriented to person, place, and time.     Cranial Nerves: No cranial nerve deficit.     Deep Tendon Reflexes: Reflexes are normal and symmetric.  Psychiatric:        Behavior: Behavior normal.        Thought Content: Thought content normal.  Judgment: Judgment normal.       BP (!) 145/78   Pulse (!) 58   Temp 97.6 F (36.4 C) (Temporal)   Ht 5' 9"  (1.753 m)   Wt 149 lb 6.4 oz (67.8 kg)   SpO2 98%   BMI 22.06 kg/m      Assessment & Plan:  [name] comes in today with chief complaint of Medical Management of Chronic Issues (rakes patient )   Diagnosis and orders addressed:  1. Essential hypertension - carvedilol (COREG) 3.125 MG tablet; Take 1 tablet (3.125 mg total) by mouth 2 (two) times daily. (Needs to be seen before next refill)  Dispense: 180 tablet; Refill: 3 - lisinopril (ZESTRIL) 5 MG tablet; TAKE 1 TABLET DAILY. TAKE ALONG WITH 2.5 MG TABLET TO MAKE 7.5 MG.  Dispense: 90  tablet; Refill: 3 - lisinopril (ZESTRIL) 2.5 MG tablet; Take 1 tablet (2.5 mg total) by mouth daily. (Needs to be seen before next refill)  Dispense: 90 tablet; Refill: 3 - CMP14+EGFR - CBC with Differential/Platelet  2. Coronary artery disease involving native coronary artery of native heart without angina pectoris - carvedilol (COREG) 3.125 MG tablet; Take 1 tablet (3.125 mg total) by mouth 2 (two) times daily. (Needs to be seen before next refill)  Dispense: 180 tablet; Refill: 3 - CMP14+EGFR - CBC with Differential/Platelet  3. HYPERCHOLESTEROLEMIA-PURE - rosuvastatin (CRESTOR) 10 MG tablet; Take 1 tablet (10 mg total) by mouth daily. (Needs to be seen before next refill)  Dispense: 90 tablet; Refill: 2 - CMP14+EGFR - CBC with Differential/Platelet  4. ASCVD (arteriosclerotic cardiovascular disease) - clopidogrel (PLAVIX) 75 MG tablet; Take 1 tablet (75 mg total) by mouth daily.  Dispense: 90 tablet; Refill: 2 - CMP14+EGFR - CBC with Differential/Platelet  5. Ischemic cardiomyopathy - CMP14+EGFR - CBC with Differential/Platelet  6. Nicotine abuse - CMP14+EGFR - CBC with Differential/Platelet  7. Recovering alcoholic in remission ([place])  - CMP14+EGFR - CBC with Differential/Platelet  8. TOBACCO ABUSE - CMP14+EGFR - CBC with Differential/Platelet  9. Vitamin D deficiency - CMP14+EGFR - CBC with Differential/Platelet  10. Simple chronic bronchitis ([place])  11. Alcoholic cardiomyopathy ([place])   Labs pending Health Maintenance reviewed Diet and exercise encouraged  Follow up plan: 6 months   Evelina Dun, FNP

## 2019-10-30 LAB — CBC WITH DIFFERENTIAL/PLATELET
Basophils Absolute: 0.1 10*3/uL (ref 0.0–0.2)
Basos: 1 %
EOS (ABSOLUTE): 0.1 10*3/uL (ref 0.0–0.4)
Eos: 2 %
Hematocrit: 48.8 % (ref 37.5–51.0)
Hemoglobin: 16.1 g/dL (ref 13.0–17.7)
Immature Grans (Abs): 0 10*3/uL (ref 0.0–0.1)
Immature Granulocytes: 0 %
Lymphocytes Absolute: 2.2 10*3/uL (ref 0.7–3.1)
Lymphs: 34 %
MCH: 31.7 pg (ref 26.6–33.0)
MCHC: 33 g/dL (ref 31.5–35.7)
MCV: 96 fL (ref 79–97)
Monocytes Absolute: 0.6 10*3/uL (ref 0.1–0.9)
Monocytes: 9 %
Neutrophils Absolute: 3.6 10*3/uL (ref 1.4–7.0)
Neutrophils: 54 %
Platelets: 179 10*3/uL (ref 150–450)
RBC: 5.08 x10E6/uL (ref 4.14–5.80)
RDW: 12.5 % (ref 11.6–15.4)
WBC: 6.6 10*3/uL (ref 3.4–10.8)

## 2019-10-30 LAB — CMP14+EGFR
ALT: 10 IU/L (ref 0–44)
AST: 16 IU/L (ref 0–40)
Albumin/Globulin Ratio: 2.4 — ABNORMAL HIGH (ref 1.2–2.2)
Albumin: 4.5 g/dL (ref 3.8–4.8)
Alkaline Phosphatase: 68 IU/L (ref 48–121)
BUN/Creatinine Ratio: 15 (ref 10–24)
BUN: 12 mg/dL (ref 8–27)
Bilirubin Total: 0.5 mg/dL (ref 0.0–1.2)
CO2: 27 mmol/L (ref 20–29)
Calcium: 8.8 mg/dL (ref 8.6–10.2)
Chloride: 101 mmol/L (ref 96–106)
Creatinine, Ser: 0.82 mg/dL (ref 0.76–1.27)
GFR calc Af Amer: 104 mL/min/{1.73_m2} (ref >59)
GFR calc non Af Amer: 90 mL/min/{1.73_m2} (ref >59)
Globulin, Total: 1.9 g/dL (ref 1.5–4.5)
Glucose: 75 mg/dL (ref 65–99)
Potassium: 4.5 mmol/L (ref 3.5–5.2)
Sodium: 139 mmol/L (ref 134–144)
Total Protein: 6.4 g/dL (ref 6.0–8.5)

## 2020-06-22 DIAGNOSIS — E785 Hyperlipidemia, unspecified: Secondary | ICD-10-CM | POA: Insufficient documentation

## 2020-06-22 NOTE — Progress Notes (Signed)
Cardiology Office Note   Date:  06/23/2020   ID:  John Leon, DOB January 21, 1951, MRN [id number]  PCP:  Junie Spencer, FNP  Cardiologist:   Rollene Rotunda, MD   Chief Complaint  Patient presents with  . Coronary Artery Disease      History of Present Illness: John Leon is a 70 y.o. male who presents for follow up of CAD.  Since I last saw him he has had no new cardiovascular complaints.  Unfortunately, his 12 year old grandson died of a drug overdose.  Despite this he has remained sober now for 14.5 years.  He still works part-time.  He takes care of his own affairs but does not exercise routinely. The patient denies any new symptoms such as chest discomfort, neck or arm discomfort. There has been no new shortness of breath, PND or orthopnea. There have been no reported palpitations, presyncope or syncope.  He still smokes cigarettes.  Past Medical History:  Diagnosis Date  . Coronary artery disease    s/p DES to LAD, occluded RCA  . H/O: alcohol abuse   . Hyperlipidemia   . Hypertension   . Ischemic cardiomyopathy   . Post-infarction apical thrombus [facility])     Past Surgical History:  Procedure Laterality Date  . CORONARY STENT PLACEMENT N/A 08/2008  . None       Current Outpatient Medications  Medication Sig Dispense Refill  . aspirin 81 MG tablet Take 81 mg by mouth daily.    . carvedilol (COREG) 3.125 MG tablet Take 1 tablet (3.125 mg total) by mouth 2 (two) times daily. (Needs to be seen before next refill) 180 tablet 3  . Cholecalciferol (VITAMIN D3) 2000 UNITS TABS Take 1 tablet by mouth daily.    . clopidogrel (PLAVIX) 75 MG tablet Take 1 tablet (75 mg total) by mouth daily. 90 tablet 2  . lisinopril (ZESTRIL) 2.5 MG tablet Take 1 tablet (2.5 mg total) by mouth daily. (Needs to be seen before next refill) 90 tablet 3  . lisinopril (ZESTRIL) 5 MG tablet TAKE 1 TABLET DAILY. TAKE ALONG WITH 2.5 MG TABLET TO MAKE 7.5 MG. 90 tablet 3  . nitroGLYCERIN  (NITROSTAT) 0.4 MG SL tablet PLACE 1 TABLET (0.4 MG TOTAL) UNDER THE TONGUE EVERY 5 (FIVE) MINUTES AS NEEDED. 25 tablet 11  . rosuvastatin (CRESTOR) 10 MG tablet Take 1 tablet (10 mg total) by mouth daily. (Needs to be seen before next refill) 90 tablet 2   No current facility-administered medications for this visit.    Allergies:   Penicillins    ROS:  Please see the history of present illness.   Otherwise, review of systems are positive for none.   All other systems are reviewed and negative.    PHYSICAL EXAM: VS:  BP 128/80   Pulse 61   Ht 5\' 9"  (1.753 m)   Wt 151 lb (68.5 kg)   BMI 22.30 kg/m  , BMI Body mass index is 22.3 kg/m. GENERAL:  Well appearing NECK:  No jugular venous distention, waveform within normal limits, carotid upstroke brisk and symmetric, no bruits, no thyromegaly LUNGS: Diffuse wheezing CHEST:  Unremarkable HEART:  PMI not displaced or sustained,S1 and S2 within normal limits, no S3, no S4, no clicks, no rubs, no murmurs ABD:  Flat, positive bowel sounds normal in frequency in pitch, no bruits, no rebound, no guarding, no midline pulsatile mass, no hepatomegaly, no splenomegaly EXT:  2 plus pulses throughout, no edema, no  cyanosis no clubbing   EKG:  EKG is  ordered today. The ekg ordered today demonstrates sinus rhythm, rate rate 61, old inferior infarct, no acute ST-T wave changes.  No change from previous.  Premature ventricular contraction   Recent Labs: 10/29/2019: ALT 10; BUN 12; Creatinine, Ser 0.82; Hemoglobin 16.1; Platelets 179; Potassium 4.5; Sodium 139    Lipid Panel    Component Value Date/Time   CHOL 128 05/07/2019 1223   CHOL 146 10/16/2012 1058   TRIG 76 05/07/2019 1223   TRIG 94 01/24/2016 1044   TRIG 85 10/16/2012 1058   HDL 44 05/07/2019 1223   HDL 44 01/24/2016 1044   HDL 30 (L) 10/16/2012 1058   CHOLHDL 2.9 05/07/2019 1223   CHOLHDL 3.4 09/23/2008 0400   VLDL 9 09/23/2008 0400   LDLCALC 69 05/07/2019 1223   LDLCALC 124  (H) 10/27/2013 0932   LDLCALC 99 10/16/2012 1058      Wt Readings from Last 3 Encounters:  06/23/20 151 lb (68.5 kg)  10/29/19 149 lb 6.4 oz (67.8 kg)  05/07/19 154 lb (69.9 kg)      Other studies Reviewed: Additional studies/ records that were reviewed today include: Labs. Review of the above records demonstrates:  Please see elsewhere in the note.     ASSESSMENT AND PLAN:  CAD:   The patient has no new sypmtoms.  No further cardiovascular testing is indicated.  We will continue with aggressive risk reduction and meds as listed.  TOBACCO ABUSE:   Again we talked about this and he is unable to quit smoking.   ISCHEMIC CARDIOMYOPATHY: He had a mildly reduced ejection fraction.  He has no symptoms of volume overload.  No further imaging is suggested  DYSLIPIDEMIA:   LDL was 69 last year.  I am going to have him go back and get a fasting lipid profile.  He is currently at target.  Current medicines are reviewed at length with the patient today.  The patient does not have concerns regarding medicines.  The following changes have been made: None  Labs/ tests ordered today include:   Orders Placed This Encounter  Procedures  . Hepatic function panel  . Lipid panel  . EKG 12-Lead     Disposition:   FU with me in 1 year   Signed, Rollene Rotunda, MD  06/23/2020 2:26 PM    [facility]

## 2020-06-23 ENCOUNTER — Other Ambulatory Visit: Payer: Self-pay

## 2020-06-23 ENCOUNTER — Encounter: Payer: Self-pay | Admitting: Cardiology

## 2020-06-23 ENCOUNTER — Ambulatory Visit (INDEPENDENT_AMBULATORY_CARE_PROVIDER_SITE_OTHER): Payer: Medicare Other | Admitting: Cardiology

## 2020-06-23 VITALS — BP 128/80 | HR 61 | Ht 69.0 in | Wt 151.0 lb

## 2020-06-23 DIAGNOSIS — I255 Ischemic cardiomyopathy: Secondary | ICD-10-CM

## 2020-06-23 DIAGNOSIS — Z720 Tobacco use: Secondary | ICD-10-CM

## 2020-06-23 DIAGNOSIS — E785 Hyperlipidemia, unspecified: Secondary | ICD-10-CM | POA: Diagnosis not present

## 2020-06-23 DIAGNOSIS — I2510 Atherosclerotic heart disease of native coronary artery without angina pectoris: Secondary | ICD-10-CM | POA: Diagnosis not present

## 2020-06-23 DIAGNOSIS — Z79899 Other long term (current) drug therapy: Secondary | ICD-10-CM

## 2020-06-23 NOTE — Patient Instructions (Signed)
Medication Instructions:  The current medical regimen is effective;  continue present plan and medications.  *If you need a refill on your cardiac medications before your next appointment, please call your pharmacy*  Lab Work: Please have blood work at [facility] (Lipid/hepatic panel)  If you have labs (blood work) drawn today and your tests are completely normal, you will receive your results only by: Marland Kitchen MyChart Message (if you have MyChart) OR . A paper copy in the mail If you have any lab test that is abnormal or we need to change your treatment, we will call you to review the results.  Follow-Up: At [facility], you and your health needs are our priority.  As part of our continuing mission to provide you with exceptional heart care, we have created designated Provider Care Teams.  These Care Teams include your primary Cardiologist (physician) and Advanced Practice Providers (APPs -  Physician Assistants and Nurse Practitioners) who all work together to provide you with the care you need, when you need it.  We recommend signing up for the patient portal called "MyChart".  Sign up information is provided on this After Visit Summary.  MyChart is used to connect with patients for Virtual Visits (Telemedicine).  Patients are able to view lab/test results, encounter notes, upcoming appointments, etc.  Non-urgent messages can be sent to your provider as well.   To learn more about what you can do with MyChart, go to [email or web address].    Your next appointment:   12 month(s)  The format for your next appointment:   In Person  Provider:   Rollene Rotunda, MD   Thank you for choosing [facility]!!

## 2020-07-22 ENCOUNTER — Other Ambulatory Visit: Payer: Self-pay | Admitting: Family

## 2020-07-22 DIAGNOSIS — E7800 Pure hypercholesterolemia, unspecified: Secondary | ICD-10-CM

## 2020-08-02 ENCOUNTER — Other Ambulatory Visit: Payer: Self-pay | Admitting: Family

## 2020-08-02 DIAGNOSIS — I2510 Atherosclerotic heart disease of native coronary artery without angina pectoris: Secondary | ICD-10-CM

## 2020-08-14 ENCOUNTER — Other Ambulatory Visit: Payer: Self-pay | Admitting: Family

## 2020-08-14 DIAGNOSIS — E7800 Pure hypercholesterolemia, unspecified: Secondary | ICD-10-CM

## 2020-08-28 ENCOUNTER — Other Ambulatory Visit: Payer: Self-pay | Admitting: Family

## 2020-08-28 DIAGNOSIS — I2510 Atherosclerotic heart disease of native coronary artery without angina pectoris: Secondary | ICD-10-CM

## 2020-08-30 NOTE — Telephone Encounter (Signed)
Hawks. NTBS 30 days given 08/02/20 

## 2020-09-07 ENCOUNTER — Other Ambulatory Visit: Payer: Self-pay

## 2020-09-07 DIAGNOSIS — E7800 Pure hypercholesterolemia, unspecified: Secondary | ICD-10-CM

## 2020-09-07 MED ORDER — ROSUVASTATIN CALCIUM 10 MG PO TABS: 10.0000 mg | ORAL_TABLET | Freq: Every day | ORAL | 2 refills | Status: DC

## 2020-10-06 ENCOUNTER — Ambulatory Visit (INDEPENDENT_AMBULATORY_CARE_PROVIDER_SITE_OTHER): Payer: Medicare Other

## 2020-10-06 DIAGNOSIS — Z0000 Encounter for general adult medical examination without abnormal findings: Secondary | ICD-10-CM | POA: Diagnosis not present

## 2020-10-06 NOTE — Progress Notes (Signed)
MEDICARE ANNUAL WELLNESS VISIT  10/06/2020  Telephone Visit Disclaimer This Medicare AWV was conducted by telephone due to national recommendations for restrictions regarding the COVID-19 Pandemic (e.g. social distancing).  I verified, using two identifiers, that I am speaking with John Boyerobert C Cordial or their authorized healthcare agent. I discussed the limitations, risks, security, and privacy concerns of performing an evaluation and management service by telephone and the potential availability of an in-person appointment in the future. The patient expressed understanding and agreed to proceed.  Location of Patient: Home Location of Provider (nurse):  [facility]  Subjective:    John BoyerRobert C Leon is a 70 y.o. male patient of Hawks, Edilia Bohristy A, FNP who had a Medicare Annual Wellness Visit today via telephone. John MaduroRobert is Working part time and lives alone. He has three children. He reports that he is socially active and does interact with friends/family regularly. He is minimally physically active and enjoys listening to live music and camping.  Patient Care Team: Junie SpencerHawks, Christy A, FNP as PCP - General (Family Medicine) Rollene RotundaHochrein, James, MD as PCP - Cardiology (Cardiology)  Advanced Directives 10/06/2020 10/06/2019 09/27/2018  Does Patient Have a Medical Advance Directive? No No No  Would patient like information on creating a medical advance directive? No - Patient declined No - Patient declined No - Patient declined    Hospital Utilization Over the Past 12 Months: # of hospitalizations or ER visits: 0 # of surgeries: 0  Review of Systems    Patient reports that his overall health is unchanged compared to last year.  History obtained from the patient  Patient Reported Readings (BP, Pulse, CBG, Weight, etc) none  Pain Assessment Pain : No/denies pain     Current Medications & Allergies (verified) Allergies as of 10/06/2020      Reactions   Penicillins    REACTION: Reaction not known       Medication List       Accurate as of October 06, 2020  9:56 AM. If you have any questions, ask your nurse or doctor.        aspirin 81 MG tablet Take 81 mg by mouth daily.   carvedilol 3.125 MG tablet Commonly known as: COREG Take 1 tablet (3.125 mg total) by mouth 2 (two) times daily. (Needs to be seen before next refill)   clopidogrel 75 MG tablet Commonly known as: PLAVIX Take 1 tablet (75 mg total) by mouth daily. (NEEDS TO BE SEEN BEFORE NEXT REFILL)   lisinopril 5 MG tablet Commonly known as: ZESTRIL TAKE 1 TABLET DAILY. TAKE ALONG WITH 2.5 MG TABLET TO MAKE 7.5 MG.   lisinopril 2.5 MG tablet Commonly known as: ZESTRIL Take 1 tablet (2.5 mg total) by mouth daily. (Needs to be seen before next refill)   nitroGLYCERIN 0.4 MG SL tablet Commonly known as: NITROSTAT PLACE 1 TABLET (0.4 MG TOTAL) UNDER THE TONGUE EVERY 5 (FIVE) MINUTES AS NEEDED.   rosuvastatin 10 MG tablet Commonly known as: CRESTOR Take 1 tablet (10 mg total) by mouth daily. (Needs to be seen before next refill)   Vitamin D3 50 MCG (2000 UT) Tabs Take 1 tablet by mouth daily.       History (reviewed): Past Medical History:  Diagnosis Date  . Coronary artery disease    s/p DES to LAD, occluded RCA  . H/O: alcohol abuse   . Hyperlipidemia   . Hypertension   . Ischemic cardiomyopathy   . Post-infarction apical thrombus (HCC)    Past  Surgical History:  Procedure Laterality Date  . CORONARY STENT PLACEMENT N/A 08/2008  . None     Family History  Problem Relation Age of Onset  . Heart attack Father 76  . Aneurysm Father 68       brain  . CAD Father   . Dementia Mother   . Arthritis Maternal Grandmother   . Heart attack Maternal Grandfather   . Lung disease Brother        agent orange  . Healthy Daughter   . Healthy Son   . Healthy Son    Social History   Socioeconomic History  . Marital status: Divorced    Spouse name: Not on file  . Number of children: 3  . Years of  education: 16  . Highest education level: Bachelor's degree (e.g., BA, AB, BS)  Occupational History  . Occupation: retired     Comment: Airline pilot  Tobacco Use  . Smoking status: Current Every Day Smoker    Packs/day: 1.00    Years: 40.00    Pack years: 40.00    Types: Cigarettes    Start date: 06/22/1971  . Smokeless tobacco: Never Used  Vaping Use  . Vaping Use: Never used  Substance and Sexual Activity  . Alcohol use: No  . Drug use: No  . Sexual activity: Not Currently  Other Topics Concern  . Not on file  Social History Narrative  . Not on file   Social Determinants of Health   Financial Resource Strain: Not on file  Food Insecurity: Not on file  Transportation Needs: Not on file  Physical Activity: Not on file  Stress: Not on file  Social Connections: Not on file    Activities of Daily Living In your present state of health, do you have any difficulty performing the following activities: 10/06/2020  Hearing? N  Vision? N  Difficulty concentrating or making decisions? N  Walking or climbing stairs? N  Dressing or bathing? N  Doing errands, shopping? N  Preparing Food and eating ? N  Using the Toilet? N  In the past six months, have you accidently leaked urine? N  Do you have problems with loss of bowel control? N  Managing your Medications? N  Managing your Finances? N  Housekeeping or managing your Housekeeping? N  Some recent data might be hidden    Patient Education/ Literacy How often do you need to have someone help you when you read instructions, pamphlets, or other written materials from your doctor or pharmacy?: 1 - Never What is the last grade level you completed in school?: Bachelor's degree  Exercise Current Exercise Habits: The patient does not participate in regular exercise at present, Exercise limited by: cardiac condition(s)  Diet Patient reports consuming 2 meals a day and 1 snack(s) a day Patient reports that his primary diet is:  Regular Patient reports that he does have regular access to food.   Depression Screen PHQ 2/9 Scores 10/29/2019 10/06/2019 01/22/2019 09/27/2018 05/24/2018 10/03/2017 04/04/2017  PHQ - 2 Score 0 0 0 0 0 0 0     Fall Risk Fall Risk  10/06/2020 10/29/2019 10/06/2019 01/22/2019 09/27/2018  Falls in the past year? 0 0 0 0 0     Objective:  John Leon seemed alert and oriented and he participated appropriately during our telephone visit.  Blood Pressure Weight BMI  BP Readings from Last 3 Encounters:  06/23/20 128/80  10/29/19 (!) 145/78  05/07/19 138/82   Wt Readings from Last  3 Encounters:  06/23/20 151 lb (68.5 kg)  10/29/19 149 lb 6.4 oz (67.8 kg)  05/07/19 154 lb (69.9 kg)   BMI Readings from Last 1 Encounters:  06/23/20 22.30 kg/m    *Unable to obtain current vital signs, weight, and BMI due to telephone visit type  Hearing/Vision  . John Leon did not seem to have difficulty with hearing/understanding during the telephone conversation . Reports that he has not had a formal eye exam by an eye care professional within the past year . Reports that he has not had a formal hearing evaluation within the past year *Unable to fully assess hearing and vision during telephone visit type  Cognitive Function: 6CIT Screen 10/06/2020 10/06/2019 09/27/2018  What Year? 0 points 0 points 0 points  What month? 0 points 0 points 0 points  What time? 0 points 0 points 0 points  Count back from 20 0 points 0 points 0 points  Months in reverse 0 points 0 points 0 points  Repeat phrase 0 points 0 points 0 points  Total Score 0 0 0   (Normal:0-7, Significant for Dysfunction: >8)  Normal Cognitive Function Screening: Yes   Immunization & Health Maintenance Record Immunization History  Administered Date(s) Administered  . Fluad Quad(high Dose 65+) 01/22/2019  . Influenza Split 02/01/2013  . Influenza Whole 12/30/2009  . Influenza, High Dose Seasonal PF 04/04/2017, 04/10/2018, 02/25/2020  .  Influenza,inj,Quad PF,6+ Mos 03/10/2015, 01/24/2016  . Moderna Sars-Covid-2 Vaccination 06/25/2019, 07/23/2019, 03/16/2020  . Pneumococcal Conjugate-13 12/11/2014  . Pneumococcal Polysaccharide-23 01/24/2016  . Td 07/30/2001  . Tdap 10/27/2013    Health Maintenance  Topic Date Due  . Pneumococcal Vaccine 37-62 Years old (1 of 4 - PCV13) Never done  . Zoster Vaccines- Shingrix (1 of 2) Never done  . COLONOSCOPY (Pts 45-44yrs Insurance coverage will need to be confirmed)  12/31/2019  . INFLUENZA VACCINE  11/29/2020  . TETANUS/TDAP  10/28/2023  . COVID-19 Vaccine  Completed  . Hepatitis C Screening  Completed  . PNA vac Low Risk Adult  Completed  . HPV VACCINES  Aged Out  . COLON CANCER SCREENING ANNUAL FOBT  Discontinued       Assessment  This is a routine wellness examination for John Leon.  Health Maintenance: Due or Overdue Health Maintenance Due  Topic Date Due  . Pneumococcal Vaccine 32-68 Years old (1 of 4 - PCV13) Never done  . Zoster Vaccines- Shingrix (1 of 2) Never done  . COLONOSCOPY (Pts 45-87yrs Insurance coverage will need to be confirmed)  12/31/2019    John Leon does not need a referral for Community Assistance: Care Management:   no Social Work:    no Prescription Assistance:  no Nutrition/Diabetes Education:  no   Plan:  Personalized Goals Goals Addressed            This Visit's Progress   . Patient Stated       10/06/2020 AWV Goal: Exercise for General Health   Patient will verbalize understanding of the benefits of increased physical activity:  Exercising regularly is important. It will improve your overall fitness, flexibility, and endurance.  Regular exercise also will improve your overall health. It can help you control your weight, reduce stress, and improve your bone density.  Over the next year, patient will increase physical activity as tolerated with a goal of at least 150 minutes of moderate physical activity per week.    You can tell that you are exercising at a moderate intensity  if your heart starts beating faster and you start breathing faster but can still hold a conversation.  Moderate-intensity exercise ideas include:  Walking 1 mile (1.6 km) in about 15 minutes  Biking  Hiking  Golfing  Dancing  Water aerobics  Patient will verbalize understanding of everyday activities that increase physical activity by providing examples like the following: ? Yard work, such as: ? Pushing a Surveyor, mining ? Raking and bagging leaves ? Washing your car ? Pushing a stroller ? Shoveling snow ? Gardening ? Washing windows or floors  Patient will be able to explain general safety guidelines for exercising:   Before you start a new exercise program, talk with your health care provider.  Do not exercise so much that you hurt yourself, feel dizzy, or get very short of breath.  Wear comfortable clothes and wear shoes with good support.  Drink plenty of water while you exercise to prevent dehydration or heat stroke.  Work out until your breathing and your heartbeat get faster.       Personalized Health Maintenance & Screening Recommendations  Colorectal cancer screening  Lung Cancer Screening Recommended: yes (Low Dose CT Chest recommended if Age 40-80 years, 30 pack-year currently smoking OR have quit w/in past 15 years) Hepatitis C Screening recommended: no HIV Screening recommended: no  Advanced Directives: Written information was not prepared per patient's request.  Referrals & Orders No orders of the defined types were placed in this encounter.   Follow-up Plan . Follow-up with Junie Spencer, FNP as planned . Schedule colonoscopy    I have personally reviewed and noted the following in the patient's chart:   . Medical and social history . Use of alcohol, tobacco or illicit drugs  . Current medications and supplements . Functional ability and status . Nutritional  status . Physical activity . Advanced directives . List of other physicians . Hospitalizations, surgeries, and ER visits in previous 12 months . Vitals . Screenings to include cognitive, depression, and falls . Referrals and appointments  In addition, I have reviewed and discussed with John Leon certain preventive protocols, quality metrics, and best practice recommendations. A written personalized care plan for preventive services as well as general preventive health recommendations is available and can be mailed to the patient at his request.      Mariam Dollar, LPN    12/02/6960

## 2020-10-24 ENCOUNTER — Other Ambulatory Visit: Payer: Self-pay | Admitting: Family

## 2020-10-24 DIAGNOSIS — I10 Essential (primary) hypertension: Secondary | ICD-10-CM

## 2020-10-31 ENCOUNTER — Other Ambulatory Visit: Payer: Self-pay | Admitting: Family

## 2020-10-31 DIAGNOSIS — I2510 Atherosclerotic heart disease of native coronary artery without angina pectoris: Secondary | ICD-10-CM

## 2020-10-31 DIAGNOSIS — I10 Essential (primary) hypertension: Secondary | ICD-10-CM

## 2020-11-01 ENCOUNTER — Other Ambulatory Visit: Payer: Self-pay | Admitting: Family

## 2020-11-01 DIAGNOSIS — I10 Essential (primary) hypertension: Secondary | ICD-10-CM

## 2020-11-04 ENCOUNTER — Other Ambulatory Visit: Payer: Self-pay

## 2020-11-04 ENCOUNTER — Telehealth: Payer: Self-pay

## 2020-11-04 DIAGNOSIS — I2510 Atherosclerotic heart disease of native coronary artery without angina pectoris: Secondary | ICD-10-CM

## 2020-11-04 DIAGNOSIS — I10 Essential (primary) hypertension: Secondary | ICD-10-CM

## 2020-11-04 MED ORDER — CLOPIDOGREL BISULFATE 75 MG PO TABS: 75.0000 mg | ORAL_TABLET | Freq: Every day | ORAL | 6 refills | Status: DC

## 2020-11-04 MED ORDER — CARVEDILOL 3.125 MG PO TABS: 3.1250 mg | ORAL_TABLET | Freq: Two times a day (BID) | ORAL | 3 refills | Status: DC

## 2020-11-04 NOTE — Telephone Encounter (Signed)
Plavix refill approved and sent in

## 2020-11-16 ENCOUNTER — Other Ambulatory Visit: Payer: Self-pay | Admitting: Family

## 2020-11-16 DIAGNOSIS — I10 Essential (primary) hypertension: Secondary | ICD-10-CM

## 2020-11-16 NOTE — Telephone Encounter (Signed)
Hawks. NTBS 30 days given 10/24/20

## 2020-12-16 ENCOUNTER — Encounter: Payer: Self-pay | Admitting: *Deleted

## 2020-12-20 ENCOUNTER — Other Ambulatory Visit: Payer: Medicare Other

## 2020-12-20 ENCOUNTER — Other Ambulatory Visit: Payer: Self-pay

## 2020-12-20 DIAGNOSIS — E785 Hyperlipidemia, unspecified: Secondary | ICD-10-CM | POA: Diagnosis not present

## 2020-12-20 DIAGNOSIS — Z79899 Other long term (current) drug therapy: Secondary | ICD-10-CM | POA: Diagnosis not present

## 2020-12-21 LAB — LIPID PANEL
Chol/HDL Ratio: 3.1 ratio (ref 0.0–5.0)
Cholesterol, Total: 120 mg/dL (ref 100–199)
HDL: 39 mg/dL — ABNORMAL LOW (ref >39)
LDL Chol Calc (NIH): 48 mg/dL (ref 0–99)
Triglycerides: 203 mg/dL — ABNORMAL HIGH (ref 0–149)
VLDL Cholesterol Cal: 33 mg/dL (ref 5–40)

## 2020-12-21 LAB — HEPATIC FUNCTION PANEL
ALT: 9 IU/L (ref 0–44)
AST: 17 IU/L (ref 0–40)
Albumin: 4.3 g/dL (ref 3.7–4.7)
Alkaline Phosphatase: 68 IU/L (ref 44–121)
Bilirubin Total: 0.3 mg/dL (ref 0.0–1.2)
Bilirubin, Direct: 0.12 mg/dL (ref 0.00–0.40)
Total Protein: 6 g/dL (ref 6.0–8.5)

## 2020-12-24 ENCOUNTER — Telehealth: Payer: Self-pay | Admitting: *Deleted

## 2020-12-24 DIAGNOSIS — I10 Essential (primary) hypertension: Secondary | ICD-10-CM

## 2020-12-24 MED ORDER — LISINOPRIL 2.5 MG PO TABS: 2.5000 mg | ORAL_TABLET | Freq: Every day | ORAL | 3 refills | Status: DC

## 2020-12-24 NOTE — Telephone Encounter (Signed)
-----   Message from Rollene Rotunda, MD sent at 12/22/2020 11:59 AM EDT ----- The lipid profile looks okay except his triglycerides are slightly higher. Dietary management should focus on attaining and maintaining a healthy weight and reduction of intake of simple carbohydrates, (<6 percent calories of added sugar and 30 to 35 percent calories of total fat.)  Dietary fat is not a primary source for liver TG, and higher-fat diets do not raise fasting plasma TG levels in most people. Nevertheless, a change in the types of fat (ie, reducing saturated versus poly- and monounsaturated fats) is recommended . I suggest increased consumption of fish that contain high amounts of omega-3 fatty acids (baked not fried.)    No other change in therapy is indicated.

## 2021-01-12 ENCOUNTER — Other Ambulatory Visit: Payer: Self-pay

## 2021-01-12 DIAGNOSIS — I10 Essential (primary) hypertension: Secondary | ICD-10-CM

## 2021-01-12 MED ORDER — LISINOPRIL 2.5 MG PO TABS
2.5000 mg | ORAL_TABLET | Freq: Every day | ORAL | 3 refills | Status: DC
Start: 2021-01-12 — End: 2022-03-13

## 2021-01-14 ENCOUNTER — Telehealth: Payer: Self-pay | Admitting: Cardiology

## 2021-01-14 DIAGNOSIS — I10 Essential (primary) hypertension: Secondary | ICD-10-CM

## 2021-01-14 MED ORDER — LISINOPRIL 5 MG PO TABS: ORAL_TABLET | ORAL | 3 refills | Status: DC

## 2021-01-14 NOTE — Telephone Encounter (Signed)
 *  STAT* If patient is at the pharmacy, call can be transferred to refill team.   1. Which medications need to be refilled? (please list name of each medication and dose if known) lisinopril (ZESTRIL) 5 MG tablet  2. Which pharmacy/location (including street and city if local pharmacy) is medication to be sent to? [id number] - [place],  - [address]  3. Do they need a 30 day or 90 day supply? 90 days  Pt she needs refill of his lisinopril 5 mg

## 2021-04-30 ENCOUNTER — Other Ambulatory Visit: Payer: Self-pay | Admitting: Cardiology

## 2021-04-30 DIAGNOSIS — I2510 Atherosclerotic heart disease of native coronary artery without angina pectoris: Secondary | ICD-10-CM

## 2021-05-29 ENCOUNTER — Other Ambulatory Visit: Payer: Self-pay | Admitting: Cardiology

## 2021-05-29 DIAGNOSIS — E7800 Pure hypercholesterolemia, unspecified: Secondary | ICD-10-CM

## 2021-08-23 NOTE — Progress Notes (Signed)
?  ?Cardiology Office Note ? ? ?Date:  08/24/2021  ? ?ID:  John Leon, DOB Dec 24, 1950, MRN [id number] ? ?PCP:  Sharion Balloon, FNP  ?Cardiologist:   Minus Breeding, MD ? ? ?Chief Complaint  ?Patient presents with  ? Coronary Artery Disease  ? ? ?  ?History of Present Illness: ?John Leon is a 71 y.o. male who presents for follow up of CAD.  Since I last saw him    He is nohe has done well.   He is now 16 years sober.  The patient denies any new symptoms such as chest discomfort, neck or arm discomfort. There has been no new shortness of breath, PND or orthopnea. There have been no reported palpitations, presyncope or syncope.  ? ? ?Past Medical History:  ?Diagnosis Date  ? Coronary artery disease   ? s/p DES to LAD, occluded RCA  ? H/O: alcohol abuse   ? Hyperlipidemia   ? Hypertension   ? Ischemic cardiomyopathy   ? Post-infarction apical thrombus ([place])   ? ? ?Past Surgical History:  ?Procedure Laterality Date  ? CORONARY STENT PLACEMENT N/A 08/2008  ? None    ? ? ? ?Current Outpatient Medications  ?Medication Sig Dispense Refill  ? aspirin 81 MG tablet Take 81 mg by mouth daily.    ? carvedilol (COREG) 3.125 MG tablet Take 1 tablet (3.125 mg total) by mouth 2 (two) times daily. 180 tablet 3  ? Cholecalciferol (VITAMIN D3) 2000 UNITS TABS Take 1 tablet by mouth daily.    ? clopidogrel (PLAVIX) 75 MG tablet TAKE 1 TABLET BY MOUTH EVERY DAY 90 tablet 2  ? lisinopril (ZESTRIL) 2.5 MG tablet Take 1 tablet (2.5 mg total) by mouth daily. (Needs to be seen before next refill) 90 tablet 3  ? lisinopril (ZESTRIL) 5 MG tablet TAKE 1 TABLET DAILY. TAKE ALONG WITH 2.5 MG TABLET TO MAKE 7.5 MG. 90 tablet 3  ? nitroGLYCERIN (NITROSTAT) 0.4 MG SL tablet PLACE 1 TABLET (0.4 MG TOTAL) UNDER THE TONGUE EVERY 5 (FIVE) MINUTES AS NEEDED. 25 tablet 11  ? rosuvastatin (CRESTOR) 10 MG tablet Take 1 tablet (10 mg total) by mouth daily. 90 tablet 0  ? ?No current facility-administered medications for this visit.   ? ? ?Allergies:   Penicillins  ? ? ?ROS:  Please see the history of present illness.   Otherwise, review of systems are positive for none.   All other systems are reviewed and negative.  ? ? ?PHYSICAL EXAM: ?VS:  BP 140/80   Pulse (!) 59   Ht 5\' 9"  (1.753 m)   Wt 151 lb (68.5 kg)   BMI 22.30 kg/m?  , BMI Body mass index is 22.3 kg/m?. ?GENERAL:  Well appearing ?NECK:  No jugular venous distention, waveform within normal limits, carotid upstroke brisk and symmetric, no bruits, no thyromegaly ?LUNGS:  Clear to auscultation bilaterally ?CHEST:  Clear ?HEART:  PMI not displaced or sustained,S1 and S2 within normal limits, no S3, no S4, no clicks, no rubs, no murmurs ?ABD:  Flat, positive bowel sounds normal in frequency in pitch, no bruits, no rebound, no guarding, no midline pulsatile mass, no hepatomegaly, no splenomegaly ?EXT:  2 plus pulses throughout, no edema, no cyanosis no clubbing ? ?EKG:  EKG is  ordered today. ?The ekg ordered today demonstrates sinus rhythm, rate rate 62 old inferior infarct, no acute ST-T wave changes.  No change from previous.  Premature ventricular contraction ? ? ?Recent Labs: ?12/20/2020: ALT 9  ? ? ?  Lipid Panel ?   ?Component Value Date/Time  ? CHOL 120 12/20/2020 1548  ? CHOL 146 10/16/2012 1058  ? TRIG 203 (H) 12/20/2020 1548  ? TRIG 94 01/24/2016 1044  ? TRIG 85 10/16/2012 1058  ? HDL 39 (L) 12/20/2020 1548  ? HDL 44 01/24/2016 1044  ? HDL 30 (L) 10/16/2012 1058  ? CHOLHDL 3.1 12/20/2020 1548  ? CHOLHDL 3.4 09/23/2008 0400  ? VLDL 9 09/23/2008 0400  ? [place] 48 12/20/2020 1548  ? [place] 124 (H) 10/27/2013 0932  ? [place] 99 10/16/2012 1058  ? ?  ? ?Wt Readings from Last 3 Encounters:  ?08/24/21 151 lb (68.5 kg)  ?06/23/20 151 lb (68.5 kg)  ?10/29/19 149 lb 6.4 oz (67.8 kg)  ?  ? ? ?Other studies Reviewed: ?Additional studies/ records that were reviewed today include: Labs. ?Review of the above records demonstrates:  Please see elsewhere in the note.   ? ? ?ASSESSMENT AND  PLAN: ? ?CAD:   The patient has no new sypmtoms.  No further cardiovascular testing is indicated.  We will continue with aggressive risk reduction and meds as listed. ? ?TOBACCO ABUSE:   We again talked about the need to stop smoking and he is unable to do this. ? ?ISCHEMIC CARDIOMYOPATHY: He had a mildly reduced ejection fraction.   I will follow this clinically.  He is euvolemic.  ? ?DYSLIPIDEMIA:   LDL was 48 last year.  No change in therapy.  HDL was 39. Current medicines are reviewed at length with the patient today.  The patient does not have concerns regarding medicines. ? ?The following changes have been made: None ? ?Labs/ tests ordered today include: None ? ?Orders Placed This Encounter  ?Procedures  ? EKG 12-Lead  ? ? ? ?Disposition:   FU with me in one year ? ? ?Signed, ?Minus Breeding, MD  ?08/24/2021 3:39 PM    ?[place] ? ? ?

## 2021-08-24 ENCOUNTER — Ambulatory Visit (INDEPENDENT_AMBULATORY_CARE_PROVIDER_SITE_OTHER): Payer: Medicare Other | Admitting: Cardiology

## 2021-08-24 ENCOUNTER — Encounter: Payer: Self-pay | Admitting: Cardiology

## 2021-08-24 VITALS — BP 140/80 | HR 59 | Ht 69.0 in | Wt 151.0 lb

## 2021-08-24 DIAGNOSIS — I2510 Atherosclerotic heart disease of native coronary artery without angina pectoris: Secondary | ICD-10-CM

## 2021-08-24 DIAGNOSIS — I10 Essential (primary) hypertension: Secondary | ICD-10-CM

## 2021-08-24 NOTE — Patient Instructions (Signed)

## 2021-08-25 ENCOUNTER — Other Ambulatory Visit: Payer: Self-pay | Admitting: Cardiology

## 2021-08-25 DIAGNOSIS — E7800 Pure hypercholesterolemia, unspecified: Secondary | ICD-10-CM

## 2021-10-13 ENCOUNTER — Telehealth: Payer: Self-pay | Admitting: Family

## 2021-10-13 NOTE — Telephone Encounter (Signed)
Left message for patient to call back and schedule Medicare Annual Wellness Visit (AWV) to be completed by video or phone.   Last AWV: 10/06/2020  Please schedule at anytime with [facility] Nurse Health Advisor     45 minute appointment  Any questions, please contact me at [phone]

## 2021-12-28 ENCOUNTER — Other Ambulatory Visit: Payer: Self-pay | Admitting: Cardiology

## 2021-12-28 DIAGNOSIS — I10 Essential (primary) hypertension: Secondary | ICD-10-CM

## 2021-12-28 DIAGNOSIS — I2510 Atherosclerotic heart disease of native coronary artery without angina pectoris: Secondary | ICD-10-CM

## 2022-01-22 ENCOUNTER — Other Ambulatory Visit: Payer: Self-pay | Admitting: Cardiology

## 2022-01-22 DIAGNOSIS — I2510 Atherosclerotic heart disease of native coronary artery without angina pectoris: Secondary | ICD-10-CM

## 2022-03-11 ENCOUNTER — Other Ambulatory Visit: Payer: Self-pay | Admitting: Cardiology

## 2022-03-11 DIAGNOSIS — I10 Essential (primary) hypertension: Secondary | ICD-10-CM

## 2022-03-26 ENCOUNTER — Other Ambulatory Visit: Payer: Self-pay | Admitting: Cardiology

## 2022-03-26 DIAGNOSIS — I10 Essential (primary) hypertension: Secondary | ICD-10-CM

## 2022-03-26 DIAGNOSIS — I2510 Atherosclerotic heart disease of native coronary artery without angina pectoris: Secondary | ICD-10-CM

## 2022-06-13 ENCOUNTER — Telehealth: Payer: Self-pay | Admitting: Family

## 2022-06-13 NOTE — Telephone Encounter (Signed)
Left message for patient to call back and schedule Medicare Annual Wellness Visit (AWV) to be completed by video or phone.   Last AWV: 10/06/2020   Please schedule at anytime with [place]     Any questions, please contact me at [phone]   Thank you,   [facility] Ambulatory Clinical Support for [place] Are. [facility] Are[facility] ??[id number] or ??[id number]

## 2022-08-25 ENCOUNTER — Other Ambulatory Visit: Payer: Self-pay | Admitting: Cardiology

## 2022-08-25 DIAGNOSIS — E7800 Pure hypercholesterolemia, unspecified: Secondary | ICD-10-CM

## 2022-09-04 ENCOUNTER — Other Ambulatory Visit: Payer: Self-pay | Admitting: Cardiology

## 2022-09-04 DIAGNOSIS — I10 Essential (primary) hypertension: Secondary | ICD-10-CM

## 2022-09-28 ENCOUNTER — Other Ambulatory Visit: Payer: Self-pay | Admitting: Cardiology

## 2022-09-28 DIAGNOSIS — I10 Essential (primary) hypertension: Secondary | ICD-10-CM

## 2022-10-21 ENCOUNTER — Other Ambulatory Visit: Payer: Self-pay | Admitting: Cardiology

## 2022-10-21 DIAGNOSIS — I10 Essential (primary) hypertension: Secondary | ICD-10-CM

## 2022-11-15 ENCOUNTER — Other Ambulatory Visit: Payer: Self-pay | Admitting: Cardiology

## 2022-11-15 DIAGNOSIS — I10 Essential (primary) hypertension: Secondary | ICD-10-CM

## 2022-11-19 ENCOUNTER — Other Ambulatory Visit: Payer: Self-pay | Admitting: Cardiology

## 2022-11-19 DIAGNOSIS — E7800 Pure hypercholesterolemia, unspecified: Secondary | ICD-10-CM

## 2023-01-18 ENCOUNTER — Other Ambulatory Visit: Payer: Self-pay | Admitting: Cardiology

## 2023-01-18 DIAGNOSIS — I2510 Atherosclerotic heart disease of native coronary artery without angina pectoris: Secondary | ICD-10-CM

## 2023-01-29 ENCOUNTER — Other Ambulatory Visit: Payer: Self-pay | Admitting: Cardiology

## 2023-01-29 DIAGNOSIS — I2510 Atherosclerotic heart disease of native coronary artery without angina pectoris: Secondary | ICD-10-CM

## 2023-02-08 ENCOUNTER — Other Ambulatory Visit: Payer: Self-pay | Admitting: Cardiology

## 2023-02-08 DIAGNOSIS — I2510 Atherosclerotic heart disease of native coronary artery without angina pectoris: Secondary | ICD-10-CM

## 2023-02-15 ENCOUNTER — Other Ambulatory Visit: Payer: Self-pay | Admitting: Cardiology

## 2023-02-15 DIAGNOSIS — E7800 Pure hypercholesterolemia, unspecified: Secondary | ICD-10-CM

## 2023-02-19 ENCOUNTER — Other Ambulatory Visit: Payer: Self-pay | Admitting: Cardiology

## 2023-02-19 DIAGNOSIS — I2510 Atherosclerotic heart disease of native coronary artery without angina pectoris: Secondary | ICD-10-CM

## 2023-03-12 ENCOUNTER — Telehealth: Payer: Self-pay | Admitting: Cardiology

## 2023-03-12 DIAGNOSIS — I2510 Atherosclerotic heart disease of native coronary artery without angina pectoris: Secondary | ICD-10-CM

## 2023-03-12 NOTE — Telephone Encounter (Signed)
 *  STAT* If patient is at the pharmacy, call can be transferred to refill team.   1. Which medications need to be refilled? (please list name of each medication and dose if known)   clopidogrel (PLAVIX) 75 MG tablet     2. Would you like to learn more about the convenience, safety, & potential cost savings by using the [facility]?    3. Are you open to using the [facility] ([facility].    4. Which pharmacy/location (including street and city if local pharmacy) is medication to be sent to? [id number] - [place], [place] - [address]    5. Do they need a 30 day or 90 day supply? 90 days    Pt made an appt with Dr. Antoine Poche on 03/21/23 at 1:20 pm

## 2023-03-13 MED ORDER — CLOPIDOGREL BISULFATE 75 MG PO TABS: 75.0000 mg | ORAL_TABLET | Freq: Every day | ORAL | 0 refills | Status: DC

## 2023-03-16 ENCOUNTER — Telehealth: Payer: Self-pay | Admitting: Cardiology

## 2023-03-16 ENCOUNTER — Other Ambulatory Visit: Payer: Self-pay | Admitting: Cardiology

## 2023-03-16 DIAGNOSIS — E7800 Pure hypercholesterolemia, unspecified: Secondary | ICD-10-CM

## 2023-03-16 MED ORDER — ROSUVASTATIN CALCIUM 10 MG PO TABS: 10.0000 mg | ORAL_TABLET | Freq: Every day | ORAL | 0 refills | Status: DC

## 2023-03-16 NOTE — Telephone Encounter (Signed)
*  STAT* If patient is at the pharmacy, call can be transferred to refill team.   1. Which medications need to be refilled? (please list name of each medication and dose if known) rosuvastatin (CRESTOR) 10 MG tablet    2. Would you like to learn more about the convenience, safety, & potential cost savings by using the [facility]? No     3. Are you open to using the [facility] ([facility]. No  ).   4. Which pharmacy/location (including street and city if local pharmacy) is medication to be sent to? [id number] - [place] [place] - [address]  [id number] - [place], [place] - [address]     5. Do they need a 30 day or 90 day supply? 90

## 2023-03-16 NOTE — Telephone Encounter (Signed)
Pt's medication was sent to pt's pharmacy as requested. Confirmation received.  °

## 2023-03-18 NOTE — Progress Notes (Unsigned)
  Cardiology Office Note:   Date:  03/21/2023  ID:  John Leon, DOB [birth year], MRN [id number] PCP: No primary care provider on file.  [facility] Providers Cardiologist:  Rollene Rotunda, MD {   History of Present Illness:   John Leon is a 72 y.o. male who presents for follow up of CAD.  Since I last saw him    He is nohe has done well.   He is now 17 years sober.  Since I last saw him he has had some anxiety and probably has not been eating as well.   The patient denies any new symptoms such as chest discomfort, neck or arm discomfort. There has been no new shortness of breath, PND or orthopnea. There have been no reported palpitations, presyncope or syncope.  He still does about 100 taxes every year.   ROS: As stated in the HPI and negative for all other systems.  Studies Reviewed:    EKG:   EKG Interpretation Date/Time:  Wednesday March 21 2023 13:19:03 EST Ventricular Rate:  57 PR Interval:  192 QRS Duration:  118 QT Interval:  416 QTC Calculation: 404 R Axis:   73  Text Interpretation: Sinus bradycardia with occasional Premature ventricular complexes Inferior infarct (cited on or before 23-Sep-2008) When compared with ECG of 24-Sep-2008 05:19, Premature ventricular complexes are now Present Confirmed by Rollene Rotunda ([id number]) on 03/21/2023 1:42:12 PM     Risk Assessment/Calculations:              Physical Exam:   VS:  BP 124/82   Pulse (!) 57   Ht 5\' 9"  (1.753 m)   Wt 123 lb (55.8 kg)   BMI 18.16 kg/m    Wt Readings from Last 3 Encounters:  03/21/23 123 lb (55.8 kg)  08/24/21 151 lb (68.5 kg)  06/23/20 151 lb (68.5 kg)     GEN: Well nourished, well developed in no acute distress NECK: No JVD; No carotid bruits CARDIAC: RRR, no murmurs, rubs, gallops RESPIRATORY:  Clear to auscultation without rales, wheezing or rhonchi  ABDOMEN: Soft, non-tender, non-distended EXTREMITIES:  No edema; No deformity   ASSESSMENT AND PLAN:   CAD:   The  patient has no new sypmtoms.  No further cardiovascular testing is indicated.  We will continue with aggressive risk reduction and meds as listed.  TOBACCO ABUSE:   He is unable to quit smoking.  We talked about this at length in the past.   ISCHEMIC CARDIOMYOPATHY: He had a mildly reduced ejection fraction.  He seems to be euvolemic.  I will follow this clinically.  DYSLIPIDEMIA:   LDL was not tested last year and I will check follow-up labs with a goal LDL in the 50s.     Follow up with me in one year.   Signed, Rollene Rotunda, MD

## 2023-03-21 ENCOUNTER — Other Ambulatory Visit: Payer: Self-pay | Admitting: Cardiology

## 2023-03-21 ENCOUNTER — Ambulatory Visit (INDEPENDENT_AMBULATORY_CARE_PROVIDER_SITE_OTHER): Payer: Medicare Other | Admitting: Cardiology

## 2023-03-21 ENCOUNTER — Encounter: Payer: Self-pay | Admitting: Cardiology

## 2023-03-21 ENCOUNTER — Other Ambulatory Visit: Payer: Self-pay | Admitting: *Deleted

## 2023-03-21 VITALS — BP 124/82 | HR 57 | Ht 69.0 in | Wt 123.0 lb

## 2023-03-21 DIAGNOSIS — I255 Ischemic cardiomyopathy: Secondary | ICD-10-CM | POA: Diagnosis not present

## 2023-03-21 DIAGNOSIS — I2510 Atherosclerotic heart disease of native coronary artery without angina pectoris: Secondary | ICD-10-CM

## 2023-03-21 DIAGNOSIS — E785 Hyperlipidemia, unspecified: Secondary | ICD-10-CM

## 2023-03-21 DIAGNOSIS — Z79899 Other long term (current) drug therapy: Secondary | ICD-10-CM

## 2023-03-21 DIAGNOSIS — I10 Essential (primary) hypertension: Secondary | ICD-10-CM

## 2023-03-21 NOTE — Patient Instructions (Signed)
Medication Instructions:  The current medical regimen is effective;  continue present plan and medications.  *If you need a refill on your cardiac medications before your next appointment, please call your pharmacy*  Lab Work: Please have blood work at your closest [employer] [facility] (BMP, CBC and Lipid)  If you have labs (blood work) drawn today and your tests are completely normal, you will receive your results only by: MyChart Message (if you have MyChart) OR A paper copy in the mail If you have any lab test that is abnormal or we need to change your treatment, we will call you to review the results.  Follow-Up: At [facility], you and your health needs are our priority.  As part of our continuing mission to provide you with exceptional heart care, we have created designated Provider Care Teams.  These Care Teams include your primary Cardiologist (physician) and Advanced Practice Providers (APPs -  Physician Assistants and Nurse Practitioners) who all work together to provide you with the care you need, when you need it.  We recommend signing up for the patient portal called "MyChart".  Sign up information is provided on this After Visit Summary.  MyChart is used to connect with patients for Virtual Visits (Telemedicine).  Patients are able to view lab/test results, encounter notes, upcoming appointments, etc.  Non-urgent messages can be sent to your provider as well.   To learn more about what you can do with MyChart, go to [email or web address].    Your next appointment:   1 year(s)  Provider:   Rollene Rotunda, MD

## 2023-04-14 ENCOUNTER — Other Ambulatory Visit: Payer: Self-pay | Admitting: Cardiology

## 2023-04-14 DIAGNOSIS — E7800 Pure hypercholesterolemia, unspecified: Secondary | ICD-10-CM

## 2023-04-16 ENCOUNTER — Other Ambulatory Visit: Payer: Self-pay

## 2023-04-16 DIAGNOSIS — E7800 Pure hypercholesterolemia, unspecified: Secondary | ICD-10-CM

## 2023-04-16 MED ORDER — ROSUVASTATIN CALCIUM 10 MG PO TABS: 10.0000 mg | ORAL_TABLET | Freq: Every day | ORAL | 3 refills | Status: DC

## 2023-06-17 ENCOUNTER — Other Ambulatory Visit: Payer: Self-pay | Admitting: Cardiology

## 2023-06-17 DIAGNOSIS — I10 Essential (primary) hypertension: Secondary | ICD-10-CM

## 2024-03-09 ENCOUNTER — Other Ambulatory Visit: Payer: Self-pay | Admitting: Cardiology

## 2024-03-09 DIAGNOSIS — I10 Essential (primary) hypertension: Secondary | ICD-10-CM

## 2024-03-09 DIAGNOSIS — I2510 Atherosclerotic heart disease of native coronary artery without angina pectoris: Secondary | ICD-10-CM

## 2024-03-12 ENCOUNTER — Other Ambulatory Visit: Payer: Self-pay | Admitting: Cardiology

## 2024-03-12 DIAGNOSIS — I10 Essential (primary) hypertension: Secondary | ICD-10-CM

## 2024-03-12 DIAGNOSIS — I2510 Atherosclerotic heart disease of native coronary artery without angina pectoris: Secondary | ICD-10-CM

## 2024-04-02 ENCOUNTER — Other Ambulatory Visit: Payer: Self-pay | Admitting: Cardiology

## 2024-04-02 DIAGNOSIS — E7800 Pure hypercholesterolemia, unspecified: Secondary | ICD-10-CM

## 2024-04-04 ENCOUNTER — Other Ambulatory Visit: Payer: Self-pay | Admitting: Cardiology

## 2024-04-04 DIAGNOSIS — I2510 Atherosclerotic heart disease of native coronary artery without angina pectoris: Secondary | ICD-10-CM

## 2024-04-04 DIAGNOSIS — I10 Essential (primary) hypertension: Secondary | ICD-10-CM

## 2024-04-09 ENCOUNTER — Other Ambulatory Visit: Payer: Self-pay | Admitting: Cardiology

## 2024-04-09 DIAGNOSIS — I10 Essential (primary) hypertension: Secondary | ICD-10-CM

## 2024-04-13 ENCOUNTER — Other Ambulatory Visit: Payer: Self-pay | Admitting: Cardiology

## 2024-04-13 DIAGNOSIS — I2510 Atherosclerotic heart disease of native coronary artery without angina pectoris: Secondary | ICD-10-CM

## 2024-04-16 ENCOUNTER — Other Ambulatory Visit: Payer: Self-pay | Admitting: Cardiology

## 2024-04-16 DIAGNOSIS — E7800 Pure hypercholesterolemia, unspecified: Secondary | ICD-10-CM

## 2024-04-16 NOTE — Telephone Encounter (Signed)
 Please call our office to schedule an overdue appointment with Dr. Lavona before anymore refills. [phone]. Thank you 3rd and last attempt

## 2024-04-17 ENCOUNTER — Other Ambulatory Visit: Payer: Self-pay | Admitting: Cardiology

## 2024-04-17 DIAGNOSIS — I10 Essential (primary) hypertension: Secondary | ICD-10-CM

## 2024-04-17 DIAGNOSIS — I2510 Atherosclerotic heart disease of native coronary artery without angina pectoris: Secondary | ICD-10-CM

## 2024-04-22 ENCOUNTER — Other Ambulatory Visit: Payer: Self-pay | Admitting: Cardiology

## 2024-04-22 DIAGNOSIS — I10 Essential (primary) hypertension: Secondary | ICD-10-CM

## 2024-04-22 NOTE — Telephone Encounter (Signed)
 Pt must schedule an overdue followup appt with Cardiology for any more refills. [phone] 3rd and last attempt Thank You

## 2024-04-26 ENCOUNTER — Other Ambulatory Visit: Payer: Self-pay | Admitting: Cardiology

## 2024-04-26 DIAGNOSIS — I2510 Atherosclerotic heart disease of native coronary artery without angina pectoris: Secondary | ICD-10-CM

## 2024-04-26 DIAGNOSIS — I10 Essential (primary) hypertension: Secondary | ICD-10-CM

## 2024-05-14 ENCOUNTER — Other Ambulatory Visit: Payer: Self-pay | Admitting: Cardiology

## 2024-05-14 DIAGNOSIS — I10 Essential (primary) hypertension: Secondary | ICD-10-CM

## 2024-05-14 DIAGNOSIS — I2510 Atherosclerotic heart disease of native coronary artery without angina pectoris: Secondary | ICD-10-CM

## 2024-05-14 NOTE — Telephone Encounter (Signed)
 Pt must schedule an overdue followup appt with Cardiology for any more refills. [phone] 3rd Final attempt Thank You

## 2024-05-18 ENCOUNTER — Other Ambulatory Visit: Payer: Self-pay | Admitting: Cardiology

## 2024-05-18 DIAGNOSIS — I10 Essential (primary) hypertension: Secondary | ICD-10-CM

## 2024-05-25 ENCOUNTER — Other Ambulatory Visit: Payer: Self-pay | Admitting: Cardiology

## 2024-05-25 DIAGNOSIS — I2510 Atherosclerotic heart disease of native coronary artery without angina pectoris: Secondary | ICD-10-CM

## 2024-05-28 ENCOUNTER — Other Ambulatory Visit: Payer: Self-pay | Admitting: Cardiology

## 2024-05-28 DIAGNOSIS — I10 Essential (primary) hypertension: Secondary | ICD-10-CM
